# Patient Record
Sex: Female | Born: 1970 | Race: White | Hispanic: No | Marital: Married | Smoking: Never smoker
Health system: Southern US, Community
[De-identification: ages and names within clinical notes are randomized; demographics above are authoritative.]

## PROBLEM LIST (undated history)

## (undated) DIAGNOSIS — F419 Anxiety disorder, unspecified: Secondary | ICD-10-CM

## (undated) DIAGNOSIS — N938 Other specified abnormal uterine and vaginal bleeding: Secondary | ICD-10-CM

## (undated) DIAGNOSIS — E669 Obesity, unspecified: Secondary | ICD-10-CM

## (undated) DIAGNOSIS — Z87440 Personal history of urinary (tract) infections: Secondary | ICD-10-CM

## (undated) DIAGNOSIS — F32A Depression, unspecified: Secondary | ICD-10-CM

## (undated) DIAGNOSIS — F329 Major depressive disorder, single episode, unspecified: Secondary | ICD-10-CM

## (undated) DIAGNOSIS — N979 Female infertility, unspecified: Secondary | ICD-10-CM

## (undated) DIAGNOSIS — J45909 Unspecified asthma, uncomplicated: Secondary | ICD-10-CM

## (undated) DIAGNOSIS — E782 Mixed hyperlipidemia: Secondary | ICD-10-CM

## (undated) DIAGNOSIS — E66811 Obesity, class 1: Secondary | ICD-10-CM

## (undated) DIAGNOSIS — K8689 Other specified diseases of pancreas: Secondary | ICD-10-CM

## (undated) DIAGNOSIS — K219 Gastro-esophageal reflux disease without esophagitis: Secondary | ICD-10-CM

## (undated) DIAGNOSIS — Z8742 Personal history of other diseases of the female genital tract: Secondary | ICD-10-CM

## (undated) DIAGNOSIS — J309 Allergic rhinitis, unspecified: Secondary | ICD-10-CM

## (undated) DIAGNOSIS — R7301 Impaired fasting glucose: Secondary | ICD-10-CM

## (undated) DIAGNOSIS — N951 Menopausal and female climacteric states: Secondary | ICD-10-CM

## (undated) DIAGNOSIS — K589 Irritable bowel syndrome without diarrhea: Secondary | ICD-10-CM

## (undated) DIAGNOSIS — M1990 Unspecified osteoarthritis, unspecified site: Secondary | ICD-10-CM

## (undated) DIAGNOSIS — F5104 Psychophysiologic insomnia: Secondary | ICD-10-CM

## (undated) DIAGNOSIS — E559 Vitamin D deficiency, unspecified: Secondary | ICD-10-CM

## (undated) DIAGNOSIS — O24419 Gestational diabetes mellitus in pregnancy, unspecified control: Secondary | ICD-10-CM

## (undated) HISTORY — DX: Female infertility, unspecified: N97.9

## (undated) HISTORY — DX: Personal history of other diseases of the female genital tract: Z87.42

## (undated) HISTORY — DX: Mixed hyperlipidemia: E78.2

## (undated) HISTORY — DX: Depression, unspecified: F32.A

## (undated) HISTORY — DX: Unspecified asthma, uncomplicated: J45.909

## (undated) HISTORY — DX: Psychophysiologic insomnia: F51.04

## (undated) HISTORY — DX: Personal history of urinary (tract) infections: Z87.440

## (undated) HISTORY — DX: Other specified diseases of pancreas: K86.89

## (undated) HISTORY — PX: COLONOSCOPY: SHX174

## (undated) HISTORY — DX: Gastro-esophageal reflux disease without esophagitis: K21.9

## (undated) HISTORY — DX: Obesity, unspecified: E66.9

## (undated) HISTORY — DX: Other specified abnormal uterine and vaginal bleeding: N93.8

## (undated) HISTORY — DX: Anxiety disorder, unspecified: F41.9

## (undated) HISTORY — DX: Allergic rhinitis, unspecified: J30.9

## (undated) HISTORY — DX: Irritable bowel syndrome, unspecified: K58.9

## (undated) HISTORY — DX: Major depressive disorder, single episode, unspecified: F32.9

## (undated) HISTORY — DX: Obesity, class 1: E66.811

## (undated) HISTORY — DX: Menopausal and female climacteric states: N95.1

## (undated) HISTORY — DX: Impaired fasting glucose: R73.01

## (undated) HISTORY — DX: Vitamin D deficiency, unspecified: E55.9

## (undated) HISTORY — DX: Gestational diabetes mellitus in pregnancy, unspecified control: O24.419

## (undated) HISTORY — DX: Unspecified osteoarthritis, unspecified site: M19.90

---

## 2002-05-08 HISTORY — PX: ESOPHAGOGASTRODUODENOSCOPY: SHX1529

## 2012-07-23 ENCOUNTER — Encounter: Payer: Self-pay | Admitting: Medical

## 2012-07-23 ENCOUNTER — Telehealth: Payer: Self-pay | Admitting: Family Medicine

## 2012-07-23 ENCOUNTER — Ambulatory Visit (INDEPENDENT_AMBULATORY_CARE_PROVIDER_SITE_OTHER): Payer: BC Managed Care – HMO | Admitting: Medical

## 2012-07-23 ENCOUNTER — Other Ambulatory Visit: Payer: Self-pay | Admitting: Medical

## 2012-07-23 VITALS — BP 110/70 | HR 62 | Temp 98.4°F | Resp 16 | Ht 67.0 in | Wt 194.0 lb

## 2012-07-23 DIAGNOSIS — Z1239 Encounter for other screening for malignant neoplasm of breast: Secondary | ICD-10-CM

## 2012-07-23 DIAGNOSIS — Z0000 Encounter for general adult medical examination without abnormal findings: Secondary | ICD-10-CM

## 2012-07-23 DIAGNOSIS — M25549 Pain in joints of unspecified hand: Secondary | ICD-10-CM

## 2012-07-23 DIAGNOSIS — Z1231 Encounter for screening mammogram for malignant neoplasm of breast: Secondary | ICD-10-CM

## 2012-07-23 LAB — COMPREHENSIVE METABOLIC PANEL
Alkaline Phosphatase: 87 U/L (ref 39–117)
BUN: 8 mg/dL (ref 6–23)
Creat: 0.74 mg/dL (ref 0.50–1.10)
Glucose, Bld: 96 mg/dL (ref 70–99)
Sodium: 136 mEq/L (ref 135–145)
Total Bilirubin: 0.6 mg/dL (ref 0.3–1.2)
Total Protein: 7.3 g/dL (ref 6.0–8.3)

## 2012-07-23 LAB — POCT URINALYSIS DIPSTICK
Protein, UA: NEGATIVE
Spec Grav, UA: <=1.005
Urobilinogen, UA: NEGATIVE

## 2012-07-23 LAB — CBC WITH DIFFERENTIAL/PLATELET
Eosinophils Absolute: 0.1 10*3/uL (ref 0.0–0.7)
Eosinophils Relative: 2 % (ref 0–5)
HCT: 41.7 % (ref 36.0–46.0)
Hemoglobin: 14.4 g/dL (ref 12.0–15.0)
Lymphs Abs: 1.7 10*3/uL (ref 0.7–4.0)
MCH: 30.6 pg (ref 26.0–34.0)
MCV: 88.7 fL (ref 78.0–100.0)
Monocytes Relative: 6 % (ref 3–12)
RBC: 4.7 MIL/uL (ref 3.87–5.11)

## 2012-07-23 LAB — LIPID PANEL
HDL: 49 mg/dL (ref >39)
Total CHOL/HDL Ratio: 5.6 Ratio
VLDL: 59 mg/dL — ABNORMAL HIGH (ref 0–40)

## 2012-07-23 LAB — TSH: TSH: 1.792 u[IU]/mL (ref 0.350–4.500)

## 2012-07-23 NOTE — Patient Instructions (Addendum)
Begin daily multivitamin.  Consider calcium 1200mg  + Vitamin D 600mg  daily.  We will set you up for your first screening mammogram.    Let us know about follow up for breast and pelvic examination - here with me, Dr. Lynelle Doctor (female provider), or gynecology.    For joint pains, consider OTC Tylenol tablets orally as needed, topical Capsacian cream, and get regular exercise.    Preventative Care for Adults - Female      MAINTAIN REGULAR HEALTH EXAMS:  A routine yearly physical is a good way to check in with your primary care provider about your health and preventive screening. It is also an opportunity to share updates about your health and any concerns you have, and receive a thorough all-over exam.   Most health insurance companies pay for at least some preventative services.  Check with your health plan for specific coverages.  WHAT PREVENTATIVE SERVICES DO WOMEN NEED?  Adult women should have their weight and blood pressure checked regularly.   Women age 10 and older should have their cholesterol levels checked regularly.  Women should be screened for cervical cancer with a Pap smear and pelvic exam beginning at either age 31, or 3 years after they become sexually activity.    Breast cancer screening generally begins at age 57 with a mammogram and breast exam by your primary care provider.    Beginning at age 67 and continuing to age 19, women should be screened for colorectal cancer.  Certain people may need continued testing until age 31.  Updating vaccinations is part of preventative care.  Vaccinations help protect against diseases such as the flu.  Osteoporosis is a disease in which the bones lose minerals and strength as we age. Women ages 86 and over should discuss this with their caregivers, as should women after menopause who have other risk factors.  Lab tests are generally done as part of preventative care to screen for anemia and blood disorders, to screen for problems  with the kidneys and liver, to screen for bladder problems, to check blood sugar, and to check your cholesterol level.  Preventative services generally include counseling about diet, exercise, avoiding tobacco, drugs, excessive alcohol consumption, and sexually transmitted infections.    GENERAL RECOMMENDATIONS FOR GOOD HEALTH:  Healthy diet:  Eat a variety of foods, including fruit, vegetables, animal or vegetable protein, such as meat, fish, chicken, and eggs, or beans, lentils, tofu, and grains, such as rice.  Drink plenty of water daily.  Decrease saturated fat in the diet, avoid lots of red meat, processed foods, sweets, fast foods, and fried foods.  Exercise:  Aerobic exercise helps maintain good heart health. At least 30-40 minutes of moderate-intensity exercise is recommended. For example, a brisk walk that increases your heart rate and breathing. This should be done on most days of the week.   Find a type of exercise or a variety of exercises that you enjoy so that it becomes a part of your daily life.  Examples are running, walking, swimming, water aerobics, and biking.  For motivation and support, explore group exercise such as aerobic class, spin class, Zumba, Yoga,or  martial arts, etc.    Set exercise goals for yourself, such as a certain weight goal, walk or run in a race such as a 5k walk/run.  Speak to your primary care provider about exercise goals.  Disease prevention:  If you smoke or chew tobacco, find out from your caregiver how to quit. It can literally save  your life, no matter how long you have been a tobacco user. If you do not use tobacco, never begin.   Maintain a healthy diet and normal weight. Increased weight leads to problems with blood pressure and diabetes.   The Body Mass Index or BMI is a way of measuring how much of your body is fat. Having a BMI above 27 increases the risk of heart disease, diabetes, hypertension, stroke and other problems related to  obesity. Your caregiver can help determine your BMI and based on it develop an exercise and dietary program to help you achieve or maintain this important measurement at a healthful level.  High blood pressure causes heart and blood vessel problems.  Persistent high blood pressure should be treated with medicine if weight loss and exercise do not work.   Fat and cholesterol leaves deposits in your arteries that can block them. This causes heart disease and vessel disease elsewhere in your body.  If your cholesterol is found to be high, or if you have heart disease or certain other medical conditions, then you may need to have your cholesterol monitored frequently and be treated with medication.   Ask if you should have a cardiac stress test if your history suggests this. A stress test is a test done on a treadmill that looks for heart disease. This test can find disease prior to there being a problem.  Menopause can be associated with physical symptoms and risks. Hormone replacement therapy is available to decrease these. You should talk to your caregiver about whether starting or continuing to take hormones is right for you.   Osteoporosis is a disease in which the bones lose minerals and strength as we age. This can result in serious bone fractures. Risk of osteoporosis can be identified using a bone density scan. Women ages 8 and over should discuss this with their caregivers, as should women after menopause who have other risk factors. Ask your caregiver whether you should be taking a calcium supplement and Vitamin D, to reduce the rate of osteoporosis.   Avoid drinking alcohol in excess (more than two drinks per day).  Avoid use of street drugs. Do not share needles with anyone. Ask for professional help if you need assistance or instructions on stopping the use of alcohol, cigarettes, and/or drugs.  Brush your teeth twice a day with fluoride toothpaste, and floss once a day. Good oral hygiene  prevents tooth decay and gum disease. The problems can be painful, unattractive, and can cause other health problems. Visit your dentist for a routine oral and dental check up and preventive care every 6-12 months.   Look at your skin regularly.  Use a mirror to look at your back. Notify your caregivers of changes in moles, especially if there are changes in shapes, colors, a size larger than a pencil eraser, an irregular border, or development of new moles.  Safety:  Use seatbelts 100% of the time, whether driving or as a passenger.  Use safety devices such as hearing protection if you work in environments with loud noise or significant background noise.  Use safety glasses when doing any work that could send debris in to the eyes.  Use a helmet if you ride a bike or motorcycle.  Use appropriate safety gear for contact sports.  Talk to your caregiver about gun safety.  Use sunscreen with a SPF (or skin protection factor) of 15 or greater.  Lighter skinned people are at a greater risk of skin cancer.  Don't forget to also wear sunglasses in order to protect your eyes from too much damaging sunlight. Damaging sunlight can accelerate cataract formation.   Practice safe sex. Use condoms. Condoms are used for birth control and to help reduce the spread of sexually transmitted infections (or STIs).  Some of the STIs are gonorrhea (the clap), chlamydia, syphilis, trichomonas, herpes, HPV (human papilloma virus) and HIV (human immunodeficiency virus) which causes AIDS. The herpes, HIV and HPV are viral illnesses that have no cure. These can result in disability, cancer and death.   Keep carbon monoxide and smoke detectors in your home functioning at all times. Change the batteries every 6 months or use a model that plugs into the wall.   Vaccinations:  Stay up to date with your tetanus shots and other required immunizations. You should have a booster for tetanus every 10 years. Be sure to get your flu shot  every year, since 5%-20% of the [place] population comes down with the flu. The flu vaccine changes each year, so being vaccinated once is not enough. Get your shot in the fall, before the flu season peaks.   Other vaccines to consider:  Human Papilloma Virus or HPV causes cancer of the cervix, and other infections that can be transmitted from person to person. There is a vaccine for HPV, and females should get immunized between the ages of 36 and 90. It requires a series of 3 shots.   Pneumococcal vaccine to protect against certain types of pneumonia.  This is normally recommended for adults age 49 or older.  However, adults younger than 41 years old with certain underlying conditions such as diabetes, heart or lung disease should also receive the vaccine.  Shingles vaccine to protect against Varicella Zoster if you are older than age 16, or younger than 41 years old with certain underlying illness.  Hepatitis A vaccine to protect against a form of infection of the liver by a virus acquired from food.  Hepatitis B vaccine to protect against a form of infection of the liver by a virus acquired from blood or body fluids, particularly if you work in health care.  If you plan to travel internationally, check with your local health department for specific vaccination recommendations.  Cancer Screening:  Breast cancer screening is essential to preventive care for women. All women age 74 and older should perform a breast self-exam every month. At age 42 and older, women should have their caregiver complete a breast exam each year. Women at ages 4 and older should have a mammogram (x-ray film) of the breasts. Your caregiver can discuss how often you need mammograms.    Cervical cancer screening includes taking a Pap smear (sample of cells examined under a microscope) from the cervix (end of the uterus). It also includes testing for HPV (Human Papilloma Virus, which can cause cervical cancer). Screening and  a pelvic exam should begin at age 23, or 3 years after a woman becomes sexually active. Screening should occur every year, with a Pap smear but no HPV testing, up to age 27. After age 70, you should have a Pap smear every 3 years with HPV testing, if no HPV was found previously.   Most routine colon cancer screening begins at the age of 9. On a yearly basis, doctors may provide special easy to use take-home tests to check for hidden blood in the stool. Sigmoidoscopy or colonoscopy can detect the earliest forms of colon cancer and is life saving. These tests  use a small camera at the end of a tube to directly examine the colon. Speak to your caregiver about this at age 68, when routine screening begins (and is repeated every 5 years unless early forms of pre-cancerous polyps or small growths are found).

## 2012-07-23 NOTE — Progress Notes (Signed)
Subjective:   HPI  Nancy Adams is a 42 y.o. female who presents for a complete physical.  New patient.   I saw her husband yesterday as new patient.   They just moved here recently from [place].     Preventative care: Last ophthalmology visit:n/a- just moved to the area Last dental visit:just moved to the area  Last colonoscopy: 2005 Last mammogram:not yet Last gynecological exam:june 2013 Last EKG:05/2012 Last labs:n/a  Prior vaccinations: TD or Tdap:2008 Influenza:01/2012 Pneumococcal:n/a Shingles/Zostavax:n/a Other:   Advanced directive:n/a Health care power of attorney:n/a Living will:n/a  Concerns: Pains in thumbs, hx/o toe arthritis.  No injury, trauma, no swelling, no morning stiffness.  Reviewed their medical, surgical, family, social, medication, and allergy history and updated chart as appropriate.   Past Medical History  Diagnosis Date  . Gestational diabetes   . Allergic rhinitis   . Asthma     mild intermittent  . History of urinary tract infection   . GERD (gastroesophageal reflux disease)   . Depression   . Anxiety   . Irritable bowel syndrome (IBS)     mixed type  . Arthritis     great toes, thumbs  . Routine gynecological examination 10/2011    normal pap per pt, no prior abnormal pap    Past Surgical History  Procedure Laterality Date  . Cesarean section    . Colonoscopy  2005    normal per pt  . Esophagogastroduodenoscopy  2004    normal per pt    Family History  Problem Relation Age of Onset  . Arthritis Mother   . Heart disease Father 67  . Hypertension Father   . Asthma Sister   . Allergic rhinitis Sister   . Asthma Brother   . Allergic rhinitis Brother   . Heart disease Paternal Grandmother   . Cancer Paternal Grandmother     leukemia, lived to age 84  . Diabetes Neg Hx   . Stroke Neg Hx     History   Social History  . Marital Status: Married    Spouse Name: N/A    Number of Children: N/A  . Years of Education:  N/A   Occupational History  . Not on file.   Social History Main Topics  . Smoking status: Never Smoker   . Smokeless tobacco: Not on file  . Alcohol Use: 8.4 oz/week    7 Glasses of wine, 7 Cans of beer per week  . Drug Use: No  . Sexually Active: Not on file   Other Topics Concern  . Not on file   Social History Narrative   Married, homemaker, has 2 twin boys, 42yo, exercises with biking, ellipitical, 3-4 days per week, roman catholic    No current outpatient prescriptions on file prior to visit.   No current facility-administered medications on file prior to visit.    Allergies  Allergen Reactions  . Penicillins     Reaction unknown     Review of Systems Constitutional: -fever, -chills, -sweats, -unexpected weight change, -decreased appetite, -fatigue Allergy: -sneezing, -itching, -congestion Dermatology: -changing moles, --rash, -lumps ENT: -runny nose, -ear pain, -sore throat, -hoarseness, -sinus pain, -teeth pain, - ringing in ears, -hearing loss, -nosebleeds Cardiology: -chest pain, +palpitations, -swelling, -difficulty breathing when lying flat, -waking up short of breath Respiratory: -cough, -shortness of breath, -difficulty breathing with exercise or exertion, -wheezing, -coughing up blood Gastroenterology: -abdominal pain, +nausea, -vomiting, +diarrhea, -constipation, -blood in stool, -changes in bowel movement, -difficulty swallowing or eating Hematology: -bleeding, -  bruising  Musculoskeletal: +joint aches, +muscle aches, -joint swelling, -back pain, -neck pain, -cramping, -changes in gait Ophthalmology: denies vision changes, eye redness, itching, discharge Urology: -burning with urination, -difficulty urinating, -blood in urine, -urinary frequency, -urgency, -incontinence Neurology: -headache, -weakness, -tingling, -numbness, -memory loss, -falls, -dizziness Psychology: -depressed mood, -agitation, +sleep problems     Objective:   Physical Exam  Filed  Vitals:   07/23/12 1423  BP: 110/70  Temp: 98.4 F (36.9 C)    General appearance: alert, no distress, WD/WN, white female Skin: few scattered benign appearing lesions, no worrisome lesions HEENT: normocephalic, conjunctiva/corneas normal, sclerae anicteric, PERRLA, EOMi, nares patent, no discharge or erythema, pharynx normal Oral cavity: MMM, tongue normal, teeth in good repair Neck: supple, no lymphadenopathy, no thyromegaly, no masses, normal ROM, no bruits Chest: non tender, normal shape and expansion Heart: RRR, normal S1, S2, no murmurs Lungs: CTA bilaterally, no wheezes, rhonchi, or rales Abdomen: +bs, soft, non tender, non distended, no masses, no hepatomegaly, no splenomegaly, no bruits Back: non tender, normal ROM, no scoliosis Musculoskeletal: upper extremities non tender, no obvious deformity, normal ROM throughout, lower extremities non tender, no obvious deformity, normal ROM throughout Extremities: no edema, no cyanosis, no clubbing Pulses: 2+ symmetric, upper and lower extremities, normal cap refill Neurological: alert, oriented x 3, CN2-12 intact, strength normal upper extremities and lower extremities, sensation normal throughout, DTRs 2+ throughout, no cerebellar signs, gait normal Psychiatric: normal affect, behavior normal, pleasant  Breast/gyn - deferred   Assessment and Plan :    Encounter Diagnoses  Name Primary?  . Routine general medical examination at a health care facility Yes  . Screening for breast cancer   . Arthralgia of hand, unspecified laterality     Physical exam - discussed healthy lifestyle, diet, exercise, preventative care, vaccinations, and addressed their concerns.  Handout given.  Limit ETOH use.  Advised opthalmolgoy and dentist f/u.    Refer for mammogram.  She will consider options for routine gyn care.   She can return here for female exam with me, Dr. Lynelle Doctor, or we can refer to gyn.  She will let us know.  Arthralgia - advised daily  exercise, can use OTC Tylenol, topical capsacian cream prn.  Follow-up pending labs

## 2012-07-23 NOTE — Telephone Encounter (Signed)
Patient is aware of her mammogram appointment on 08/21/12/@ 1200 pm @ [facility]. CLS [phone]

## 2012-07-24 ENCOUNTER — Other Ambulatory Visit: Payer: Self-pay | Admitting: Medical

## 2012-07-24 LAB — VITAMIN D 25 HYDROXY (VIT D DEFICIENCY, FRACTURES): Vit D, 25-Hydroxy: 15 ng/mL — ABNORMAL LOW (ref 30–89)

## 2012-07-24 MED ORDER — ERGOCALCIFEROL 1.25 MG (50000 UT) PO CAPS: 50000.0000 [IU] | ORAL_CAPSULE | ORAL | Status: DC

## 2012-07-29 ENCOUNTER — Ambulatory Visit (INDEPENDENT_AMBULATORY_CARE_PROVIDER_SITE_OTHER): Payer: BC Managed Care – HMO | Admitting: Medical

## 2012-07-29 ENCOUNTER — Telehealth: Payer: Self-pay | Admitting: Family Medicine

## 2012-07-29 ENCOUNTER — Encounter: Payer: Self-pay | Admitting: Medical

## 2012-07-29 VITALS — BP 98/70 | HR 68 | Temp 97.9°F | Wt 197.0 lb

## 2012-07-29 DIAGNOSIS — N390 Urinary tract infection, site not specified: Secondary | ICD-10-CM

## 2012-07-29 LAB — POCT URINALYSIS DIPSTICK
Ketones, UA: NEGATIVE
Protein, UA: NEGATIVE
Spec Grav, UA: <=1.005
Urobilinogen, UA: NEGATIVE
pH, UA: 7

## 2012-07-29 MED ORDER — NITROFURANTOIN MONOHYD MACRO 100 MG PO CAPS: 100.0000 mg | ORAL_CAPSULE | Freq: Two times a day (BID) | ORAL | Status: DC

## 2012-07-29 NOTE — Telephone Encounter (Signed)
Patient is aware of her appointment with Dr. Seymour Bars on 08/08/12 @ 1045. I fax over OV notes, labs and insurance card. [phone] [facility]

## 2012-07-29 NOTE — Progress Notes (Signed)
Subjective:  Nancy Adams is a 43 y.o. female who complains of burning with urination, dysuria, frequency, suprapubic pressure and urgency. She has had symptoms for 3 days. Patient also complains of cloudy urine and LGF last night. Patient denies back pain and nausea, vomiting. Patient does not have a history of recurrent UTI. Patient does not have a history of pyelonephritis.  Last UTI years ago.  She did have intercourse recently.   No other aggravating or relieving factors.   Objective:    Filed Vitals:   07/29/12 1059  BP: 98/70  Pulse: 68  Temp: 97.9 F (36.6 C)    General appearance: alert, no distress, WD/WN, female Abdomen: +bs, soft, mild lower abdominal generalized tenderness, no upper abdominal tenderness, non distended, no masses, no hepatomegaly, no splenomegaly, no bruits Back: no CVA tenderness Pulses: 2+ symmetric      Assessment and Plan:   Encounter Diagnosis  Name Primary?  . UTI (urinary tract infection) Yes   Given the UA findings and symptoms, begin Macrobid empirically.  Increase water intake, add cranberry juice oral, begin Macrobid, urine sent for culture, recheck if not improving in the next few days.

## 2012-08-21 ENCOUNTER — Ambulatory Visit: Payer: Self-pay

## 2012-09-02 ENCOUNTER — Other Ambulatory Visit: Payer: Self-pay | Admitting: Medical

## 2012-09-02 ENCOUNTER — Telehealth: Payer: Self-pay | Admitting: Medical

## 2012-09-02 MED ORDER — SERTRALINE HCL 25 MG PO TABS: ORAL_TABLET | ORAL | Status: DC

## 2012-09-02 NOTE — Telephone Encounter (Signed)
Fax refill request from [facility]  Zoloft 50 mg #30 last filled 06/21/12

## 2012-09-25 ENCOUNTER — Ambulatory Visit
Admission: RE | Admit: 2012-09-25 | Discharge: 2012-09-25 | Disposition: A | Discharge status: Home / Self Care | Payer: BC Managed Care – PPO | Source: Ambulatory Visit | Attending: Medical | Admitting: Medical

## 2012-09-25 DIAGNOSIS — Z1231 Encounter for screening mammogram for malignant neoplasm of breast: Secondary | ICD-10-CM

## 2012-10-02 ENCOUNTER — Encounter: Payer: Self-pay | Admitting: Family Medicine

## 2012-11-18 ENCOUNTER — Telehealth: Payer: Self-pay | Admitting: Medical

## 2012-11-25 ENCOUNTER — Other Ambulatory Visit: Payer: Self-pay | Admitting: Medical

## 2012-11-25 ENCOUNTER — Telehealth: Payer: Self-pay | Admitting: Medical

## 2012-11-25 MED ORDER — SERTRALINE HCL 25 MG PO TABS: ORAL_TABLET | ORAL | Status: DC

## 2012-11-25 NOTE — Telephone Encounter (Signed)
25 mg prescription sent.

## 2012-11-26 NOTE — Telephone Encounter (Signed)
RX WAS CHANGED TO 1 A DAY & I CALLED [facility] & NEW RX WENT THRU, PT INFORMED

## 2013-02-25 ENCOUNTER — Other Ambulatory Visit: Payer: Self-pay | Admitting: Medical

## 2013-02-25 NOTE — Telephone Encounter (Signed)
I called and let a detailed message for the patient making her aware that she will need to make a OV. CLS

## 2013-02-25 NOTE — Telephone Encounter (Signed)
IS THIS OK 

## 2013-07-18 ENCOUNTER — Encounter: Payer: Self-pay | Admitting: Family Medicine

## 2013-07-18 ENCOUNTER — Ambulatory Visit (INDEPENDENT_AMBULATORY_CARE_PROVIDER_SITE_OTHER): Payer: BC Managed Care – PPO | Admitting: Family Medicine

## 2013-07-18 VITALS — BP 131/94 | HR 85 | Temp 98.4°F | Resp 18 | Ht 66.0 in | Wt 203.0 lb

## 2013-07-18 DIAGNOSIS — K589 Irritable bowel syndrome without diarrhea: Secondary | ICD-10-CM

## 2013-07-18 DIAGNOSIS — F341 Dysthymic disorder: Secondary | ICD-10-CM

## 2013-07-18 DIAGNOSIS — F32A Depression, unspecified: Secondary | ICD-10-CM

## 2013-07-18 DIAGNOSIS — Z124 Encounter for screening for malignant neoplasm of cervix: Secondary | ICD-10-CM | POA: Insufficient documentation

## 2013-07-18 DIAGNOSIS — F419 Anxiety disorder, unspecified: Secondary | ICD-10-CM

## 2013-07-18 DIAGNOSIS — K582 Mixed irritable bowel syndrome: Secondary | ICD-10-CM | POA: Insufficient documentation

## 2013-07-18 DIAGNOSIS — F329 Major depressive disorder, single episode, unspecified: Secondary | ICD-10-CM

## 2013-07-18 MED ORDER — SERTRALINE HCL 25 MG PO TABS: ORAL_TABLET | ORAL | Status: DC

## 2013-07-18 MED ORDER — HYOSCYAMINE SULFATE 0.125 MG SL SUBL: SUBLINGUAL_TABLET | SUBLINGUAL | Status: DC

## 2013-07-18 NOTE — Progress Notes (Signed)
Office Note 07/18/2013  CC:  Chief Complaint  Patient presents with  . Establish Care    moved from [place]   . Irritable Bowel Syndrome    HPI:  Nancy Adams is a 43 y.o. White female who is here to establish care. Patient's most recent primary MD: Saw Mr.Tysinger (PA) in [place] [facility] once for initial visit/CPE last year. Moved here from [place] just over a year ago. Old records in EPIC/[facility] EMR were reviewed prior to or during today's visit.  Says IBS worse last 6 mo, she thinks maybe b/c of poor diet lately, also stressful life changes with move the last 1 yr.  Postprandial diarrhea is what is specifically worse.   Saw a GI MD about 10 yrs ago--battery of tests done.  No blood or mucous in stool.  Has been on miralax in past but nothing for loose BMs.    Past Medical History  Diagnosis Date  . Gestational diabetes   . Allergic rhinitis   . Asthma     mild intermittent  . History of urinary tract infection   . GERD (gastroesophageal reflux disease)   . Depression   . Anxiety   . Irritable bowel syndrome (IBS)     mixed type  . Arthritis     great toes, thumbs  . Routine gynecological examination 10/2011    normal pap per pt, no prior abnormal pap    Past Surgical History  Procedure Laterality Date  . Cesarean section    . Colonoscopy  2005    normal per pt  . Esophagogastroduodenoscopy  2004    normal per pt    Family History  Problem Relation Age of Onset  . Arthritis Mother   . Allergic rhinitis Mother   . Heart disease Father 55  . Hypertension Father   . Asthma Sister   . Allergic rhinitis Sister   . Asthma Brother   . Allergic rhinitis Brother   . Heart disease Paternal Grandmother   . Cancer Paternal Grandmother     leukemia, lived to age 42  . Diabetes Neg Hx   . Stroke Neg Hx     History   Social History  . Marital Status: Married    Spouse Name: N/A    Number of Children: N/A  . Years of Education: N/A   Occupational History   . Not on file.   Social History Main Topics  . Smoking status: Never Smoker   . Smokeless tobacco: Never Used  . Alcohol Use: 0.0 oz/week     Comment: socially  . Drug Use: No  . Sexual Activity: Not on file   Other Topics Concern  . Not on file   Social History Narrative   Married, has 2 twin boys, Careers information officer.   Relocated from [place].   Exercises with biking, ellipitical, 3-4 days per week, [employer].   No tobacco.  No drugs.  Social alcohol.   Not taking nitrofurantoin or vitamin D listed below Outpatient Encounter Prescriptions as of 07/18/2013  Medication Sig  . albuterol (PROVENTIL HFA;VENTOLIN HFA) 108 (90 BASE) MCG/ACT inhaler Inhale 2 puffs into the lungs every 6 (six) hours as needed for wheezing.  Marland Kitchen doxylamine, Sleep, (UNISOM) 25 MG tablet Take 25 mg by mouth at bedtime as needed for sleep.  Marland Kitchen sertraline (ZOLOFT) 25 MG tablet TAKE ONE TABLET BY MOUTH ONE TIME DAILY  . [DISCONTINUED] sertraline (ZOLOFT) 25 MG tablet TAKE ONE TABLET BY MOUTH ONE TIME  DAILY   . ergocalciferol (VITAMIN D2) 50000 UNITS capsule Take 1 capsule (50,000 Units total) by mouth once a week.  . hyoscyamine (LEVSIN SL) 0.125 MG SL tablet 1 tab po qid prn abd bloating/cramping/frequent loose BMs  . [DISCONTINUED] nitrofurantoin, macrocrystal-monohydrate, (MACROBID) 100 MG capsule Take 1 capsule (100 mg total) by mouth 2 (two) times daily.    Allergies  Allergen Reactions  . Penicillins     Reaction unknown    ROS Review of Systems  Constitutional: Negative for fever and fatigue.  HENT: Negative for congestion and sore throat.   Eyes: Negative for visual disturbance.  Respiratory: Negative for cough.   Cardiovascular: Negative for chest pain.  Gastrointestinal: Positive for abdominal pain and diarrhea. Negative for nausea.       Per hpi   Genitourinary: Negative for dysuria.  Musculoskeletal: Negative for back pain and joint swelling.  Skin: Negative for rash.   Neurological: Negative for weakness and headaches.  Hematological: Negative for adenopathy.    PE; Blood pressure 131/94, pulse 85, temperature 98.4 F (36.9 C), temperature source Temporal, resp. rate 18, height 5' 6"  (1.676 m), weight 203 lb (92.08 kg), SpO2 97.00%. Gen: Alert, well appearing.  Patient is oriented to person, place, time, and situation. VUY:EBXI: no injection, icteris, swelling, or exudate.  EOMI, PERRLA. Mouth: lips without lesion/swelling.  Oral mucosa pink and moist. Oropharynx without erythema, exudate, or swelling.  CV: RRR, no m/r/g.   LUNGS: CTA bilat, nonlabored resps, good aeration in all lung fields. ABD: soft, NT, ND, BS normal.  No hepatospenomegaly or mass.  No bruits. EXT: no clubbing, cyanosis, or edema.   Pertinent labs:  none  ASSESSMENT AND PLAN:   New pt; no old records to obtain at this time.  Irritable bowel syndrome with constipation and diarrhea Diarrhea predominant the last few years. Will do trial of levsin 0.125, 1-2 qid prn. Therapeutic expectations and side effect profile of medication discussed today.  Patient's questions answered.   Anxiety and depression Stable on 37m sertraline qd. RF'd this med today.  Cervical cancer screening Pt desires referral to GYN for this. No hx of abnormal PAPs.  An After Visit Summary was printed and given to the patient.  Return for at patient's convenience for fasting CPE.

## 2013-07-18 NOTE — Assessment & Plan Note (Signed)
Diarrhea predominant the last few years. Will do trial of levsin 0.125, 1-2 qid prn. Therapeutic expectations and side effect profile of medication discussed today.  Patient's questions answered.

## 2013-07-18 NOTE — Assessment & Plan Note (Signed)
Pt desires referral to GYN for this. No hx of abnormal PAPs.

## 2013-07-18 NOTE — Assessment & Plan Note (Signed)
Stable on 82m sertraline qd. RF'd this med today.

## 2013-07-18 NOTE — Progress Notes (Signed)
Pre visit review using our clinic review tool, if applicable. No additional management support is needed unless otherwise documented below in the visit note. 

## 2013-07-24 ENCOUNTER — Encounter: Payer: Self-pay | Admitting: Family Medicine

## 2013-07-24 ENCOUNTER — Ambulatory Visit (INDEPENDENT_AMBULATORY_CARE_PROVIDER_SITE_OTHER): Payer: BC Managed Care – PPO | Admitting: Family Medicine

## 2013-07-24 VITALS — BP 112/78 | HR 76 | Temp 97.4°F | Resp 18 | Ht 66.0 in | Wt 204.0 lb

## 2013-07-24 DIAGNOSIS — E559 Vitamin D deficiency, unspecified: Secondary | ICD-10-CM

## 2013-07-24 DIAGNOSIS — Z0000 Encounter for general adult medical examination without abnormal findings: Secondary | ICD-10-CM

## 2013-07-24 NOTE — Progress Notes (Signed)
Office Note 07/24/2013  CC:  Chief Complaint  Patient presents with  . Annual Exam    HPI:  Nancy Adams is a 43 y.o. White female who is here for CPE. No complaints. Has not had a chance to try the levsin I rx'd for her IBS recently. She is set up to see the GYN in near future.   Past Medical History  Diagnosis Date  . Gestational diabetes   . Allergic rhinitis   . Asthma     mild intermittent  . History of urinary tract infection   . GERD (gastroesophageal reflux disease)   . Depression   . Anxiety   . Irritable bowel syndrome (IBS)     mixed type  . Arthritis     great toes, thumbs  . Routine gynecological examination 10/2011    normal pap per pt, no prior abnormal pap  . Vitamin D deficiency     dx'd at [facility] 2014 per pt--she did not take any replacement vit D    Past Surgical History  Procedure Laterality Date  . Cesarean section    . Colonoscopy  2005    normal per pt  . Esophagogastroduodenoscopy  2004    normal per pt    Family History  Problem Relation Age of Onset  . Arthritis Mother   . Allergic rhinitis Mother   . Heart disease Father 25  . Hypertension Father   . Asthma Sister   . Allergic rhinitis Sister   . Asthma Brother   . Allergic rhinitis Brother   . Heart disease Paternal Grandmother   . Cancer Paternal Grandmother     leukemia, lived to age 78  . Diabetes Neg Hx   . Stroke Neg Hx     History   Social History  . Marital Status: Married    Spouse Name: N/A    Number of Children: N/A  . Years of Education: N/A   Occupational History  . Not on file.   Social History Main Topics  . Smoking status: Never Smoker   . Smokeless tobacco: Never Used  . Alcohol Use: 0.0 oz/week     Comment: socially  . Drug Use: No  . Sexual Activity: Not on file   Other Topics Concern  . Not on file   Social History Narrative   Married, has 2 twin boys, Careers information officer.   Relocated from [place].   Exercises with biking,  ellipitical, 3-4 days per week, [employer].   No tobacco.  No drugs.  Social alcohol.    Outpatient Prescriptions Prior to Visit  Medication Sig Dispense Refill  . albuterol (PROVENTIL HFA;VENTOLIN HFA) 108 (90 BASE) MCG/ACT inhaler Inhale 2 puffs into the lungs every 6 (six) hours as needed for wheezing.      Marland Kitchen doxylamine, Sleep, (UNISOM) 25 MG tablet Take 25 mg by mouth at bedtime as needed for sleep.      . ergocalciferol (VITAMIN D2) 50000 UNITS capsule Take 1 capsule (50,000 Units total) by mouth once a week.  4 capsule  2  . sertraline (ZOLOFT) 25 MG tablet TAKE ONE TABLET BY MOUTH ONE TIME DAILY  90 tablet  3  . hyoscyamine (LEVSIN SL) 0.125 MG SL tablet 1 tab po qid prn abd bloating/cramping/frequent loose BMs  30 tablet  3   No facility-administered medications prior to visit.    Allergies  Allergen Reactions  . Penicillins     Reaction unknown    ROS Review of  Systems  Constitutional: Negative for fever, chills, appetite change and fatigue.  HENT: Negative for congestion, dental problem, ear pain and sore throat.   Eyes: Negative for discharge, redness and visual disturbance.  Respiratory: Negative for cough, chest tightness, shortness of breath and wheezing.   Cardiovascular: Negative for chest pain, palpitations and leg swelling.  Gastrointestinal: Negative for nausea, vomiting, abdominal pain, diarrhea and blood in stool.  Genitourinary: Negative for dysuria, urgency, frequency, hematuria, flank pain and difficulty urinating.  Musculoskeletal: Negative for arthralgias, back pain, joint swelling, myalgias and neck stiffness.  Skin: Negative for pallor and rash.  Neurological: Negative for dizziness, speech difficulty, weakness and headaches.  Hematological: Negative for adenopathy. Does not bruise/bleed easily.  Psychiatric/Behavioral: Negative for confusion and sleep disturbance. The patient is not nervous/anxious.     PE; Blood pressure 112/78, pulse 76,  temperature 97.4 F (36.3 C), temperature source Temporal, resp. rate 18, height 5' 6"  (1.676 m), weight 204 lb (92.534 kg), last menstrual period 06/27/2013, SpO2 97.00%. Pt examined with Einar Grad, CMA as chaperone. Gen: Alert, well appearing, overweight-appearing WF in NAD.  Patient is oriented to person, place, time, and situation. AFFECT: pleasant, lucid thought and speech. ENT: Ears: EACs clear, normal epithelium.  TMs with good light reflex and landmarks bilaterally.  Eyes: no injection, icteris, swelling, or exudate.  EOMI, PERRLA. Nose: no drainage or turbinate edema/swelling.  No injection or focal lesion.  Mouth: lips without lesion/swelling.  Oral mucosa pink and moist.  Dentition intact and without obvious caries or gingival swelling.  Oropharynx without erythema, exudate, or swelling.  Neck: supple/nontender.  No LAD, mass, or TM.  Carotid pulses 2+ bilaterally, without bruits. CV: RRR, no m/r/g.   LUNGS: CTA bilat, nonlabored resps, good aeration in all lung fields. ABD: soft, NT, ND, BS normal.  No hepatospenomegaly or mass.  No bruits. EXT: no clubbing, cyanosis, or edema.  Musculoskeletal: no joint swelling, erythema, warmth, or tenderness.  ROM of all joints intact. Skin - no sores or suspicious lesions or rashes or color changes Neuro: CN 2-12 intact bilaterally, strength 5/5 in proximal and distal upper extremities and lower extremities bilaterally.   No tremor.  No disdiadochokinesis.  No ataxia.  Upper extremity and lower extremity DTRs symmetric.  No pronator drift.  Pertinent labs:  None today  ASSESSMENT AND PLAN:   Health maintenance examination Reviewed age and gender appropriate health maintenance issues (prudent diet, regular exercise, health risks of tobacco and excessive alcohol, use of seatbelts, fire alarms in home, use of sunscreen).  Also reviewed age and gender appropriate health screening as well as vaccine recommendations. Has GYN visit coming  up. HP labs + vit D level (for hx of vit D deficiency) drawn today.   An After Visit Summary was printed and given to the patient.  FOLLOW UP:  Return in about 1 year (around 07/25/2014) for annual CPE (fasting).

## 2013-07-24 NOTE — Progress Notes (Signed)
Pre visit review using our clinic review tool, if applicable. No additional management support is needed unless otherwise documented below in the visit note. 

## 2013-07-24 NOTE — Assessment & Plan Note (Signed)
Reviewed age and gender appropriate health maintenance issues (prudent diet, regular exercise, health risks of tobacco and excessive alcohol, use of seatbelts, fire alarms in home, use of sunscreen).  Also reviewed age and gender appropriate health screening as well as vaccine recommendations. Has GYN visit coming up. HP labs + vit D level (for hx of vit D deficiency) drawn today.

## 2013-07-25 LAB — COMPREHENSIVE METABOLIC PANEL
ALBUMIN: 4.3 g/dL (ref 3.5–5.2)
ALT: 22 U/L (ref 0–35)
AST: 23 U/L (ref 0–37)
Alkaline Phosphatase: 79 U/L (ref 39–117)
BUN: 7 mg/dL (ref 6–23)
CO2: 26 mEq/L (ref 19–32)
Calcium: 9.1 mg/dL (ref 8.4–10.5)
Chloride: 100 mEq/L (ref 96–112)
Creatinine, Ser: 0.7 mg/dL (ref 0.4–1.2)
GFR: 104.58 mL/min (ref >60.00)
GLUCOSE: 88 mg/dL (ref 70–99)
Potassium: 4.1 mEq/L (ref 3.5–5.1)
SODIUM: 136 meq/L (ref 135–145)
TOTAL PROTEIN: 7.6 g/dL (ref 6.0–8.3)
Total Bilirubin: 0.6 mg/dL (ref 0.3–1.2)

## 2013-07-25 LAB — LIPID PANEL
Cholesterol: 265 mg/dL — ABNORMAL HIGH (ref 0–200)
HDL: 49.3 mg/dL (ref >39.00)
LDL CALC: 162 mg/dL — AB (ref 0–99)
Total CHOL/HDL Ratio: 5
Triglycerides: 268 mg/dL — ABNORMAL HIGH (ref 0.0–149.0)
VLDL: 53.6 mg/dL — ABNORMAL HIGH (ref 0.0–40.0)

## 2013-07-25 LAB — CBC WITH DIFFERENTIAL/PLATELET
Basophils Absolute: 0 10*3/uL (ref 0.0–0.1)
Basophils Relative: 0.3 % (ref 0.0–3.0)
EOS PCT: 2.3 % (ref 0.0–5.0)
Eosinophils Absolute: 0.2 10*3/uL (ref 0.0–0.7)
HCT: 41.9 % (ref 36.0–46.0)
Hemoglobin: 14.1 g/dL (ref 12.0–15.0)
Lymphocytes Relative: 17.9 % (ref 12.0–46.0)
Lymphs Abs: 1.5 10*3/uL (ref 0.7–4.0)
MCHC: 33.6 g/dL (ref 30.0–36.0)
MCV: 95 fl (ref 78.0–100.0)
Monocytes Absolute: 0.4 10*3/uL (ref 0.1–1.0)
Monocytes Relative: 4.2 % (ref 3.0–12.0)
Neutro Abs: 6.4 10*3/uL (ref 1.4–7.7)
Neutrophils Relative %: 75.3 % (ref 43.0–77.0)
PLATELETS: 273 10*3/uL (ref 150.0–400.0)
RBC: 4.41 Mil/uL (ref 3.87–5.11)
RDW: 12.8 % (ref 11.5–14.6)
WBC: 8.6 10*3/uL (ref 4.5–10.5)

## 2013-07-25 LAB — TSH: TSH: 1 u[IU]/mL (ref 0.35–5.50)

## 2013-07-25 LAB — VITAMIN D 25 HYDROXY (VIT D DEFICIENCY, FRACTURES): Vit D, 25-Hydroxy: 27 ng/mL — ABNORMAL LOW (ref 30–89)

## 2013-12-18 ENCOUNTER — Other Ambulatory Visit: Payer: Self-pay

## 2013-12-18 DIAGNOSIS — Z1231 Encounter for screening mammogram for malignant neoplasm of breast: Secondary | ICD-10-CM

## 2014-01-02 ENCOUNTER — Ambulatory Visit
Admission: RE | Admit: 2014-01-02 | Discharge: 2014-01-02 | Disposition: A | Discharge status: Home / Self Care | Payer: BC Managed Care – PPO | Source: Ambulatory Visit

## 2014-01-02 DIAGNOSIS — Z1231 Encounter for screening mammogram for malignant neoplasm of breast: Secondary | ICD-10-CM

## 2014-05-08 DIAGNOSIS — N938 Other specified abnormal uterine and vaginal bleeding: Secondary | ICD-10-CM

## 2014-05-08 HISTORY — DX: Other specified abnormal uterine and vaginal bleeding: N93.8

## 2014-07-29 ENCOUNTER — Encounter: Payer: Self-pay | Admitting: Family Medicine

## 2014-08-03 ENCOUNTER — Encounter: Payer: Self-pay | Admitting: Family Medicine

## 2014-08-03 ENCOUNTER — Ambulatory Visit (INDEPENDENT_AMBULATORY_CARE_PROVIDER_SITE_OTHER): Payer: BLUE CROSS/BLUE SHIELD | Admitting: Family Medicine

## 2014-08-03 VITALS — BP 143/80 | HR 81 | Temp 98.1°F | Wt 202.0 lb

## 2014-08-03 DIAGNOSIS — Z0000 Encounter for general adult medical examination without abnormal findings: Secondary | ICD-10-CM

## 2014-08-03 DIAGNOSIS — N915 Oligomenorrhea, unspecified: Secondary | ICD-10-CM

## 2014-08-03 DIAGNOSIS — G4700 Insomnia, unspecified: Secondary | ICD-10-CM

## 2014-08-03 DIAGNOSIS — F411 Generalized anxiety disorder: Secondary | ICD-10-CM

## 2014-08-03 DIAGNOSIS — G2581 Restless legs syndrome: Secondary | ICD-10-CM

## 2014-08-03 LAB — COMPREHENSIVE METABOLIC PANEL
ALK PHOS: 90 U/L (ref 39–117)
ALT: 29 U/L (ref 0–35)
AST: 20 U/L (ref 0–37)
Albumin: 4.2 g/dL (ref 3.5–5.2)
BILIRUBIN TOTAL: 0.6 mg/dL (ref 0.2–1.2)
BUN: 5 mg/dL — ABNORMAL LOW (ref 6–23)
CHLORIDE: 99 meq/L (ref 96–112)
CO2: 30 mEq/L (ref 19–32)
Calcium: 9.4 mg/dL (ref 8.4–10.5)
Creatinine, Ser: 0.68 mg/dL (ref 0.40–1.20)
GFR: 100.54 mL/min (ref >60.00)
Glucose, Bld: 97 mg/dL (ref 70–99)
Potassium: 4.2 mEq/L (ref 3.5–5.1)
SODIUM: 135 meq/L (ref 135–145)
Total Protein: 7.3 g/dL (ref 6.0–8.3)

## 2014-08-03 LAB — CBC WITH DIFFERENTIAL/PLATELET
BASOS ABS: 0 10*3/uL (ref 0.0–0.1)
Basophils Relative: 0.4 % (ref 0.0–3.0)
EOS ABS: 0.3 10*3/uL (ref 0.0–0.7)
Eosinophils Relative: 3.6 % (ref 0.0–5.0)
HEMATOCRIT: 40 % (ref 36.0–46.0)
HEMOGLOBIN: 13.5 g/dL (ref 12.0–15.0)
LYMPHS ABS: 1.9 10*3/uL (ref 0.7–4.0)
Lymphocytes Relative: 20.7 % (ref 12.0–46.0)
MCHC: 33.8 g/dL (ref 30.0–36.0)
MCV: 93.1 fl (ref 78.0–100.0)
MONO ABS: 0.5 10*3/uL (ref 0.1–1.0)
Monocytes Relative: 6 % (ref 3.0–12.0)
NEUTROS ABS: 6.4 10*3/uL (ref 1.4–7.7)
Neutrophils Relative %: 69.3 % (ref 43.0–77.0)
PLATELETS: 319 10*3/uL (ref 150.0–400.0)
RBC: 4.3 Mil/uL (ref 3.87–5.11)
RDW: 13.1 % (ref 11.5–15.5)
WBC: 9.2 10*3/uL (ref 4.0–10.5)

## 2014-08-03 LAB — LIPID PANEL
CHOL/HDL RATIO: 5
CHOLESTEROL: 228 mg/dL — AB (ref 0–200)
HDL: 43.4 mg/dL (ref >39.00)
LDL CALC: 146 mg/dL — AB (ref 0–99)
NonHDL: 184.6
TRIGLYCERIDES: 192 mg/dL — AB (ref 0.0–149.0)
VLDL: 38.4 mg/dL (ref 0.0–40.0)

## 2014-08-03 LAB — POCT URINE PREGNANCY: Preg Test, Ur: NEGATIVE

## 2014-08-03 LAB — TSH: TSH: 2.6 u[IU]/mL (ref 0.35–4.50)

## 2014-08-03 MED ORDER — SERTRALINE HCL 50 MG PO TABS: 50.0000 mg | ORAL_TABLET | Freq: Every day | ORAL | Status: DC

## 2014-08-03 MED ORDER — CLONAZEPAM 0.5 MG PO TABS: ORAL_TABLET | ORAL | Status: DC

## 2014-08-03 NOTE — Progress Notes (Signed)
Pre visit review using our clinic review tool, if applicable. No additional management support is needed unless otherwise documented below in the visit note. 

## 2014-08-03 NOTE — Progress Notes (Signed)
Office Note 08/03/2014  CC:  Chief Complaint  Patient presents with  . Annual Exam    HPI:  Nancy Adams is a 44 y.o. White female who is here for annual health maintenance exam. LMP was 06/08/14. For the last year her menstrual cycles have been from 1 mo to 3 mo in between.  She has her annual GYn exam in 08/2014.  She is sexually active and does not use contraception.  Of note, levsin trial for a couple weeks did not help her IBS. Says she wants to increase her zoloft dose to 108m qhs, says the year has been very stressful/anxiety. Sites irritability, constant feeling of "keyed up", concentration impaired, low energy, constant worry.  No panic attacks.  No signif depression.  Sleep initiation, sleep maintenance, and sleep quality all a problem.  Chronic (decades) problem for her, worse the last 1 yr or so.  Ambien caused some parasomnia problems so she had to stop it.  Unisom OTC currently-has to take 3 hs to help any.  Mind will not shut down, but also in last year she endorses restless leg symptoms.  Getting up and walking around doesn't help her legs.  No daytime RLS symptoms.  Sounds like sleep hygiene is not very good due to busy/hectic life taking care of twins.     Past Medical History  Diagnosis Date  . Gestational diabetes   . Allergic rhinitis   . Asthma     mild intermittent  . History of urinary tract infection   . GERD (gastroesophageal reflux disease)   . Depression   . Anxiety   . Irritable bowel syndrome (IBS)     mixed type  . Arthritis     great toes, thumbs  . Routine gynecological examination 10/2011    normal pap per pt, no prior abnormal pap  . Vitamin D deficiency     dx'd at [facility] 2014 per pt--she did not take any replacement vit D  . Obesity, Class I, BMI 30-34.9     Past Surgical History  Procedure Laterality Date  . Cesarean section    . Colonoscopy  2005    normal per pt  . Esophagogastroduodenoscopy  2004    normal per pt    Family  History  Problem Relation Age of Onset  . Arthritis Mother   . Allergic rhinitis Mother   . Heart disease Father 630 . Hypertension Father   . Asthma Sister   . Allergic rhinitis Sister   . Asthma Brother   . Allergic rhinitis Brother   . Heart disease Paternal Grandmother   . Cancer Paternal Grandmother     leukemia, lived to age 43 . Diabetes Neg Hx   . Stroke Neg Hx     History   Social History  . Marital Status: Married    Spouse Name: N/A  . Number of Children: N/A  . Years of Education: N/A   Occupational History  . Not on file.   Social History Main Topics  . Smoking status: Never Smoker   . Smokeless tobacco: Never Used  . Alcohol Use: 0.0 oz/week     Comment: socially  . Drug Use: No  . Sexual Activity: Not on file   Other Topics Concern  . Not on file   Social History Narrative   Married, has 2 twin boys, elementary age.   Homemaker.   [place]   Exercises with biking, ellipitical, 3-4 days per week, [employer]  No tobacco.  No drugs.  Social alcohol.   MEDS: taking ca+ plus vit D tab: 2 gummies qd (not the 50K U dose q week) Outpatient Prescriptions Prior to Visit  Medication Sig Dispense Refill  . albuterol (PROVENTIL HFA;VENTOLIN HFA) 108 (90 BASE) MCG/ACT inhaler Inhale 2 puffs into the lungs every 6 (six) hours as needed for wheezing.    Marland Kitchen doxylamine, Sleep, (UNISOM) 25 MG tablet Take 25 mg by mouth at bedtime as needed for sleep.    . ergocalciferol (VITAMIN D2) 50000 UNITS capsule Take 1 capsule (50,000 Units total) by mouth once a week. 4 capsule 2  . sertraline (ZOLOFT) 25 MG tablet TAKE ONE TABLET BY MOUTH ONE TIME DAILY 90 tablet 3  . hyoscyamine (LEVSIN SL) 0.125 MG SL tablet 1 tab po qid prn abd bloating/cramping/frequent loose BMs (Patient not taking: Reported on 08/03/2014) 30 tablet 3   No facility-administered medications prior to visit.    Allergies  Allergen Reactions  . Penicillins     Reaction  unknown   ROS Review of Systems  Constitutional: Negative for fever, chills, appetite change and fatigue.  HENT: Negative for congestion, dental problem, ear pain and sore throat.   Eyes: Negative for discharge, redness and visual disturbance.  Respiratory: Negative for cough, chest tightness, shortness of breath and wheezing.   Cardiovascular: Negative for chest pain, palpitations and leg swelling.  Gastrointestinal: Negative for nausea, vomiting, abdominal pain, diarrhea and blood in stool.  Genitourinary: Negative for dysuria, urgency, frequency, hematuria, flank pain and difficulty urinating.  Musculoskeletal: Positive for arthralgias (feet pain). Negative for myalgias, back pain, joint swelling and neck stiffness.  Skin: Negative for pallor and rash.  Neurological: Negative for dizziness, speech difficulty, weakness and headaches.  Hematological: Negative for adenopathy. Does not bruise/bleed easily.  Psychiatric/Behavioral: Positive for sleep disturbance. Negative for confusion. The patient is nervous/anxious.     PE; Blood pressure 143/80, pulse 81, temperature 98.1 F (36.7 C), temperature source Oral, weight 202 lb (91.627 kg), last menstrual period 06/08/2014, SpO2 96 %.  BMI 32.6 Gen: Alert, well appearing.  Patient is oriented to person, place, time, and situation. AFFECT: pleasant, lucid thought and speech. ENT: Ears: EACs clear, normal epithelium.  TMs with good light reflex and landmarks bilaterally.  Eyes: no injection, icteris, swelling, or exudate.  EOMI, PERRLA. Nose: no drainage or turbinate edema/swelling.  No injection or focal lesion.  Mouth: lips without lesion/swelling.  Oral mucosa pink and moist.  Dentition intact and without obvious caries or gingival swelling.  Oropharynx without erythema, exudate, or swelling.  Neck: supple/nontender.  No LAD, mass, or TM.  Carotid pulses 2+ bilaterally, without bruits. CV: RRR, no m/r/g.   LUNGS: CTA bilat, nonlabored resps,  good aeration in all lung fields. ABD: soft, NT, ND, BS normal.  No hepatospenomegaly or mass.  No bruits. EXT: no clubbing, cyanosis, or edema.  Musculoskeletal: no joint swelling, erythema, warmth, or tenderness.  ROM of all joints intact. Skin - no sores or suspicious lesions or rashes or color changes   Pertinent labs:  Urine preg test today: NEG  ASSESSMENT AND PLAN:   1) Insomnia, chronic.  Worsened lately secondary to uncontrolled anxiety. Will start trial of clonazepam 0.23m, 1-2 qhs prn.  Therapeutic expectations and side effect profile of medication discussed today.  Patient's questions answered.  2) RLS: see above in #1.  3) GAD: increase zoloft to 574mqhs per pt request.  She'll likely need this med up-titrated for appropriate response so we'll  see if she will be amenable to this plan as time goes on.  4) CPE/Health maintenance exam:  Reviewed age and gender appropriate health maintenance issues (prudent diet, regular exercise, health risks of tobacco and excessive alcohol, use of seatbelts, fire alarms in home, use of sunscreen).  Also reviewed age and gender appropriate health screening as well as vaccine recommendations. Fasting HP labs today.  She declined HIV screening today.  She has had this in the past with her pregnancy: neg. GYN exam/screening coming up soon. Vacc's UTD.  An After Visit Summary was printed and given to the patient.  FOLLOW UP:  Return in about 2 weeks (around 08/17/2014) for 30 min visit musculo complaints.

## 2014-08-04 ENCOUNTER — Encounter: Payer: Self-pay | Admitting: Family Medicine

## 2014-08-04 ENCOUNTER — Other Ambulatory Visit: Payer: Self-pay | Admitting: Family Medicine

## 2014-08-09 ENCOUNTER — Encounter: Payer: Self-pay | Admitting: Family Medicine

## 2014-08-19 ENCOUNTER — Encounter: Payer: Self-pay | Admitting: Internal Medicine

## 2014-08-19 ENCOUNTER — Encounter: Payer: Self-pay | Admitting: Family Medicine

## 2014-08-19 ENCOUNTER — Ambulatory Visit (INDEPENDENT_AMBULATORY_CARE_PROVIDER_SITE_OTHER): Payer: BLUE CROSS/BLUE SHIELD | Admitting: Family Medicine

## 2014-08-19 VITALS — BP 104/80 | HR 73 | Temp 98.5°F | Ht 66.0 in | Wt 200.0 lb

## 2014-08-19 DIAGNOSIS — K5732 Diverticulitis of large intestine without perforation or abscess without bleeding: Secondary | ICD-10-CM

## 2014-08-19 DIAGNOSIS — K589 Irritable bowel syndrome without diarrhea: Secondary | ICD-10-CM

## 2014-08-19 NOTE — Progress Notes (Signed)
Pre visit review using our clinic review tool, if applicable. No additional management support is needed unless otherwise documented below in the visit note. 

## 2014-08-19 NOTE — Progress Notes (Signed)
OFFICE VISIT  08/19/2014   CC:  Chief Complaint  Patient presents with  . Follow-up    Discuss GI problems   HPI:    Patient is a 44 y.o. Caucasian female who presents for discussion of GI issues. Says she has ong hx of IBS, has had the whole process of testing done about 10 yrs ago and all was normal. Initially constip predom, then diarrhea predominant.  Lately she feels intermittent left sided abdominal pain, says on 3 occasions she actually got n/v/d and fever.  She did clear liquid diet during these periods and sx's would resolve on about 2 d of this.  Never sought medical eval during episode.   No blood in stool.  Currently no abd pain or fevers or n/v. Has her "usual" IBS sx's which are postprandial loose/urgent BM.  Sometimes up to 10 times a day.  No signif abd cramping with this.  Levsin was unhelpful.  She has never been on any other med for IBS, does not have a local GI MD.  Past Medical History  Diagnosis Date  . Gestational diabetes   . Allergic rhinitis   . Asthma     mild intermittent  . History of urinary tract infection   . GERD (gastroesophageal reflux disease)   . Anxiety and depression   . Irritable bowel syndrome (IBS)     mixed type  . Arthritis     great toes, thumbs  . Vitamin D deficiency     dx'd at [facility] 2014 per pt--she did not take any replacement vit D  . Obesity, Class I, BMI 30-34.9   . Hyperlipemia, mixed     Mild, improved w/out meds from 2015 to 2016  . Chronic insomnia     with RLS component + anxiety component contributing.    Past Surgical History  Procedure Laterality Date  . Cesarean section    . Colonoscopy  2005    normal per pt  . Esophagogastroduodenoscopy  2004    normal per pt    Outpatient Prescriptions Prior to Visit  Medication Sig Dispense Refill  . albuterol (PROVENTIL HFA;VENTOLIN HFA) 108 (90 BASE) MCG/ACT inhaler Inhale 2 puffs into the lungs every 6 (six) hours as needed for wheezing.    . calcium-vitamin D  (OSCAL WITH D) 500-200 MG-UNIT per tablet Take 1 tablet by mouth.    . clonazePAM (KLONOPIN) 0.5 MG tablet 1-2 tabs po qhs for restless legs syndrome and insomnia 60 tablet 1  . sertraline (ZOLOFT) 50 MG tablet Take 1 tablet (50 mg total) by mouth daily. 30 tablet 11   No facility-administered medications prior to visit.    Allergies  Allergen Reactions  . Penicillins     Reaction unknown    ROS As per HPI  PE: Blood pressure 104/80, pulse 73, temperature 98.5 F (36.9 C), temperature source Oral, height 5' 6"  (1.676 m), weight 200 lb (90.719 kg), last menstrual period 06/08/2014, SpO2 97 %.  Pt examined with April Miller, CMA, as chaperone. Gen: Alert, well appearing.  Patient is oriented to person, place, time, and situation. MOQ:HUTM: no injection, icteris, swelling, or exudate.  EOMI, PERRLA. Mouth: lips without lesion/swelling.  Oral mucosa pink and moist. Oropharynx without erythema, exudate, or swelling.  ABD: soft, NT/ND, BS normal.  NO HSM or mass or bruit.  LABS:  Lab Results  Component Value Date   TSH 2.60 08/03/2014   Lab Results  Component Value Date   WBC 9.2 08/03/2014   HGB  13.5 08/03/2014   HCT 40.0 08/03/2014   MCV 93.1 08/03/2014   PLT 319.0 08/03/2014   Lab Results  Component Value Date   CREATININE 0.68 08/03/2014   BUN 5* 08/03/2014   NA 135 08/03/2014   K 4.2 08/03/2014   CL 99 08/03/2014   CO2 30 08/03/2014   Lab Results  Component Value Date   ALT 29 08/03/2014   AST 20 08/03/2014   ALKPHOS 90 08/03/2014   BILITOT 0.6 08/03/2014   Lab Results  Component Value Date   CHOL 228* 08/03/2014   Lab Results  Component Value Date   HDL 43.40 08/03/2014   Lab Results  Component Value Date   LDLCALC 146* 08/03/2014   Lab Results  Component Value Date   TRIG 192.0* 08/03/2014   Lab Results  Component Value Date   CHOLHDL 5 08/03/2014    IMPRESSION AND PLAN:  1) IBS, diarrhea predominant.  Will refer to GI for consideration of  one of the newer meds that have come out to treat this.    2) Recurrent LLQ pains w/ fever, resolves with liquid diet. Suspect recurrent acute diverticulitis.  Current pt is well. Again, will ask GI to see, as she may be a candidate for a repeat of colonoscopy that was first done about 10 yrs ago. Signs/symptoms to call or return for were reviewed and pt expressed understanding.  An After Visit Summary was printed and given to the patient.  FOLLOW UP: Return for keep upcoming appt already set. (for discussion of musculoskeletal complaints)

## 2014-08-21 ENCOUNTER — Encounter: Payer: Self-pay | Admitting: Family Medicine

## 2014-08-21 ENCOUNTER — Ambulatory Visit (INDEPENDENT_AMBULATORY_CARE_PROVIDER_SITE_OTHER): Payer: BLUE CROSS/BLUE SHIELD | Admitting: Family Medicine

## 2014-08-21 VITALS — BP 110/70 | HR 91 | Temp 98.2°F | Ht 66.0 in | Wt 201.0 lb

## 2014-08-21 DIAGNOSIS — M79676 Pain in unspecified toe(s): Secondary | ICD-10-CM | POA: Diagnosis not present

## 2014-08-21 DIAGNOSIS — M2142 Flat foot [pes planus] (acquired), left foot: Secondary | ICD-10-CM

## 2014-08-21 DIAGNOSIS — M25531 Pain in right wrist: Secondary | ICD-10-CM

## 2014-08-21 DIAGNOSIS — M222X9 Patellofemoral disorders, unspecified knee: Secondary | ICD-10-CM | POA: Diagnosis not present

## 2014-08-21 DIAGNOSIS — M2141 Flat foot [pes planus] (acquired), right foot: Secondary | ICD-10-CM | POA: Diagnosis not present

## 2014-08-21 NOTE — Progress Notes (Signed)
Pre visit review using our clinic review tool, if applicable. No additional management support is needed unless otherwise documented below in the visit note. 

## 2014-08-21 NOTE — Progress Notes (Signed)
OFFICE VISIT  08/23/2014   CC: discuss musculo complaints HPI:    Patient is a 44 y.o. Caucasian female who presents to discuss musculoskeletal complaints.  Pain in both great toes: right one injured about 10 yrs ago with an odd injury in which she got her toe temporarily caught on something, x-ray showed some arthritis in IP joint exclusively.   Left one got jammed into a root while trail running about 9 yrs ago.  She did not seek medical care at that time. Was given a "shoe insert" for right toe prob and it was too uncomfortable to wear. Ever since then both toes intermittently, worse after lots of activity that causes her to flex her great toes a lot (IP joint is where pain is but it radiates up foot with certain movements).  This has led to impaired gait in which she doesn't flex toes fully and also tends to walk on outsides of her feet. No erythema, swelling, or warmth to toes involved or any other joints.  Secondly, around Christmas 2015 she recalls onset of paiin in proximal flexor mm's of forearm after she did a rapid and forcefull extension of her right elbow.  Since then it is exacerbated by fast movements of right arm or when trying to hold and move heavy objects with right arm.  No pain with forearm flexion as long as forearm is supinated.  No paresthesias.  No pain/swelling/erythema/warmth of the elbow itself.  Other joints: some intermittent bilat knee pains mainly when going up and down steps.  No swelling/eryth/warmth. Occ/intermittent pain in both thumbs with repeated periods of overuse.   No swelling.     Past Medical History  Diagnosis Date  . Gestational diabetes   . Allergic rhinitis   . Asthma     mild intermittent  . History of urinary tract infection   . GERD (gastroesophageal reflux disease)   . Anxiety and depression   . Irritable bowel syndrome (IBS)     mixed type  . Arthritis     great toes, thumbs  . Vitamin D deficiency     dx'd at [facility] 2014 per pt--she  did not take any replacement vit D  . Obesity, Class I, BMI 30-34.9   . Hyperlipemia, mixed     Mild, improved w/out meds from 2015 to 2016  . Chronic insomnia     with RLS component + anxiety component contributing.    Past Surgical History  Procedure Laterality Date  . Cesarean section    . Colonoscopy  2005    normal per pt  . Esophagogastroduodenoscopy  2004    normal per pt    Outpatient Prescriptions Prior to Visit  Medication Sig Dispense Refill  . albuterol (PROVENTIL HFA;VENTOLIN HFA) 108 (90 BASE) MCG/ACT inhaler Inhale 2 puffs into the lungs every 6 (six) hours as needed for wheezing.    . calcium-vitamin D (OSCAL WITH D) 500-200 MG-UNIT per tablet Take 1 tablet by mouth.    . clonazePAM (KLONOPIN) 0.5 MG tablet 1-2 tabs po qhs for restless legs syndrome and insomnia 60 tablet 1  . sertraline (ZOLOFT) 50 MG tablet Take 1 tablet (50 mg total) by mouth daily. 30 tablet 11   No facility-administered medications prior to visit.    Allergies  Allergen Reactions  . Penicillins     Reaction unknown    ROS As per HPI  PE: Blood pressure 110/70, pulse 91, temperature 98.2 F (36.8 C), temperature source Oral, height 5' 6"  (1.676  m), weight 201 lb (91.173 kg), last menstrual period 06/08/2014, SpO2 97 %. Gen: Alert, well appearing.  Patient is oriented to person, place, time, and situation. No swelling or erythema, or stiffness of any joints. ROM of all joints intact.  She has moderate pain when she tries to extend her great toes against resistance--the IP joint is where she notes the pain.  Mild TTP on dorsal aspect of IP joint of big toe on L foot. No laxity on valgus/varus stressing of great toe joints on either side. No heel or plantar foot tenderness.  No achilles tenderness.  Mild flexible pes planus bilaterally.  Normal ankle alignment.   Right arm with mild, vague discomfort to palpation in proximal aspect of forearm extensor complex.  No tenderness of lateral  epicondyle.  Pain with resisted R forearm pronation but not supination.  No pain with wrist flexion/extension passively or actively against resistance.  Fingers and thumbs with normal ROM and no tenderness, erythema, or warmth.  Finkelstein's neg bilat.  Knees: No swelling, erythema, or warmth.  No tenderness to palpation, no pain with patellar grind, patellar laxity wnl. No knee instability.  No popliteal mass/tenderness.  LABS:  None today  IMPRESSION AND PLAN:  1) Bilateral great toe post-traumatic ligamentous pain, intermittent, but occurring for years now. 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 Discussed 600 mg ibuprofen bid with food prn. Refer to PT at [facility].   If not improving with a few months of PT or if worsening with this then will ask Dr. Tamala Julian in Sports Medicine to see her.  2) Bilateral patellofemoral pain syndrome; mild.  Reassured pt.  3) Right forearm extensor mm strain vs atypical lateral epicondylitis.    An After Visit Summary was printed and given to the patient.  FOLLOW UP: Return in about 3 months (around 11/20/2014) for f/u musculoskeletal probs.

## 2014-09-10 ENCOUNTER — Encounter: Payer: Self-pay | Admitting: Family Medicine

## 2014-10-02 ENCOUNTER — Encounter: Payer: Self-pay | Admitting: Family Medicine

## 2014-10-14 ENCOUNTER — Other Ambulatory Visit: Payer: Self-pay | Admitting: *Deleted

## 2014-10-14 MED ORDER — CLONAZEPAM 0.5 MG PO TABS
ORAL_TABLET | ORAL | Status: DC
Start: 2014-10-14 — End: 2015-01-28

## 2014-10-14 NOTE — Telephone Encounter (Signed)
Fax from [facility]) requesting refill for Clonazepam. LOV 08/21/14, up coming ov 11/20/14, last written: 08/03/14 #60 w/ 1RF. Please advise. Thanks.

## 2014-10-16 ENCOUNTER — Ambulatory Visit (INDEPENDENT_AMBULATORY_CARE_PROVIDER_SITE_OTHER): Payer: BLUE CROSS/BLUE SHIELD | Admitting: Internal Medicine

## 2014-10-16 ENCOUNTER — Other Ambulatory Visit (INDEPENDENT_AMBULATORY_CARE_PROVIDER_SITE_OTHER): Payer: BLUE CROSS/BLUE SHIELD

## 2014-10-16 ENCOUNTER — Encounter: Payer: Self-pay | Admitting: Internal Medicine

## 2014-10-16 VITALS — BP 110/70 | HR 80 | Ht 66.0 in | Wt 199.8 lb

## 2014-10-16 DIAGNOSIS — K5732 Diverticulitis of large intestine without perforation or abscess without bleeding: Secondary | ICD-10-CM

## 2014-10-16 DIAGNOSIS — R197 Diarrhea, unspecified: Secondary | ICD-10-CM

## 2014-10-16 DIAGNOSIS — K529 Noninfective gastroenteritis and colitis, unspecified: Secondary | ICD-10-CM

## 2014-10-16 LAB — IGA: IgA: 194 mg/dL (ref 68–378)

## 2014-10-16 MED ORDER — NA SULFATE-K SULFATE-MG SULF 17.5-3.13-1.6 GM/177ML PO SOLN: ORAL | Status: DC

## 2014-10-16 NOTE — Patient Instructions (Signed)
  You have been scheduled for a colonoscopy. Please follow written instructions given to you at your visit today.  Please pick up your prep supplies at the pharmacy within the next 1-3 days. If you use inhalers (even only as needed), please bring them with you on the day of your procedure.  Your physician has requested that you go to the basement for the lab work before leaving today.   Use Imodium over the counter as needed for diarrhea.   I appreciate the opportunity to care for you.

## 2014-10-16 NOTE — Progress Notes (Signed)
Patient ID: Nancy Adams, female   DOB: 14-Feb-1971, 44 y.o.   MRN: [id number] HPI: Nancy Adams is a 44 yo female with PMH of IBS, mild asthma, hyperlipidemia, chronic insomnia who is seen in consultation at the request of Dr. Ernestine Conrad to address chronic diarrhea and possible diverticulitis. She is here alone today. She reports she was diagnosed with irritable bowel around 2003 and worked up extensively by gastroenterologist in [place]. This workup included upper and lower endoscopy, what sounds like a CT enteroscopy and a food study. She was diagnosed with IBS. Initially her IBS was constipation predominant then she got pregnant with 20s 8 years ago and had no IBS symptoms during pregnancy or for 3 years after. When IBS returned it was diarrhea and constipation and then for the last 6 months it's been considerable diarrhea. She reports 6 months of diarrhea "out-of-control". Diarrhea occurs 4-10 times daily. It is watery and never formed. Not associated with abdominal pain. Is associated with urgency and several accidents. Not nocturnal. No fevers or chills. No blood in her stool or melena. Associated with abdominal bloating. 3 times in the past year she is developed left lower quadrant abdominal pain and fever. She self diagnosed diverticulitis by reading Internet but herself on a clear liquid diet and symptoms resolved after a few days. Never treated with antibiotics. She denies a family history of celiac disease, IBD or GI tract malignancy. She is married and a stay-at-home mom. 2 twin boys who just turned 8.  Past Medical History  Diagnosis Date  . Gestational diabetes   . Allergic rhinitis   . Asthma     mild intermittent  . History of urinary tract infection   . GERD (gastroesophageal reflux disease)   . Anxiety and depression   . Irritable bowel syndrome (IBS)     mixed type  . Arthritis     great toes, thumbs  . Vitamin D deficiency     dx'd at [facility] 2014 per pt--she did not take any  replacement vit D  . Obesity, Class I, BMI 30-34.9   . Hyperlipemia, mixed     Mild, improved w/out meds from 2015 to 2016  . Chronic insomnia     with RLS component + anxiety component contributing.  . DUB (dysfunctional uterine bleeding) 2016    GYN: likely anovulatory, unremarkable pelvic u/s 09/2014.  Pt started on cyclic progesterone (403KV qd x 12d every 60d prn if no spontaneous cycle-Dr. Rivard)  . Menopausal flushing   . Female fertility problem     Past Surgical History  Procedure Laterality Date  . Cesarean section    . Colonoscopy  2005    normal per pt  . Esophagogastroduodenoscopy  2004    normal per pt    Outpatient Prescriptions Prior to Visit  Medication Sig Dispense Refill  . albuterol (PROVENTIL HFA;VENTOLIN HFA) 108 (90 BASE) MCG/ACT inhaler Inhale 2 puffs into the lungs every 6 (six) hours as needed for wheezing.    . calcium-vitamin D (OSCAL WITH D) 500-200 MG-UNIT per tablet Take 1 tablet by mouth.    . clonazePAM (KLONOPIN) 0.5 MG tablet 1-2 tabs po qhs for restless legs syndrome and insomnia 60 tablet 2  . sertraline (ZOLOFT) 50 MG tablet Take 1 tablet (50 mg total) by mouth daily. 30 tablet 11   No facility-administered medications prior to visit.    Allergies  Allergen Reactions  . Penicillins     Reaction unknown    Family History  Problem Relation  Age of Onset  . Arthritis Mother   . Allergic rhinitis Mother   . Heart disease Father 50  . Hypertension Father   . Asthma Sister   . Allergic rhinitis Sister   . Asthma Brother   . Allergic rhinitis Brother   . Heart disease Paternal Grandmother   . Cancer Paternal Grandmother     leukemia, lived to age 60  . Diabetes Neg Hx   . Stroke Neg Hx   . Colon cancer Neg Hx   . Colon polyps Neg Hx   . Crohn's disease Neg Hx     History  Substance Use Topics  . Smoking status: Never Smoker   . Smokeless tobacco: Never Used  . Alcohol Use: 0.0 oz/week     Comment: socially    ROS: As per  history of present illness, otherwise negative  BP 110/70 mmHg  Pulse 80  Ht 5' 6"  (1.676 m)  Wt 199 lb 12.8 oz (90.629 kg)  BMI 32.26 kg/m2  LMP 09/16/2014 (Exact Date) Constitutional: Well-developed and well-nourished. No distress. HEENT: Normocephalic and atraumatic. Oropharynx is clear and moist. No oropharyngeal exudate. Conjunctivae are normal.  No scleral icterus. Neck: Neck supple. Trachea midline. Cardiovascular: Normal rate, regular rhythm and intact distal pulses. No M/R/G Pulmonary/chest: Effort normal and breath sounds normal. No wheezing, rales or rhonchi. Abdominal: Soft, nontender, nondistended. Bowel sounds active throughout. There are no masses palpable. No hepatosplenomegaly. Extremities: no clubbing, cyanosis, or edema Lymphadenopathy: No cervical adenopathy noted. Neurological: Alert and oriented to person place and time. Skin: Skin is warm and dry. No rashes noted. Psychiatric: Normal mood and affect. Behavior is normal.  RELEVANT LABS AND IMAGING: CBC    Component Value Date/Time   WBC 9.2 08/03/2014 1033   RBC 4.30 08/03/2014 1033   HGB 13.5 08/03/2014 1033   HCT 40.0 08/03/2014 1033   PLT 319.0 08/03/2014 1033   MCV 93.1 08/03/2014 1033   MCH 30.6 07/23/2012 1520   MCHC 33.8 08/03/2014 1033   RDW 13.1 08/03/2014 1033   LYMPHSABS 1.9 08/03/2014 1033   MONOABS 0.5 08/03/2014 1033   EOSABS 0.3 08/03/2014 1033   BASOSABS 0.0 08/03/2014 1033    CMP     Component Value Date/Time   NA 135 08/03/2014 1033   K 4.2 08/03/2014 1033   CL 99 08/03/2014 1033   CO2 30 08/03/2014 1033   GLUCOSE 97 08/03/2014 1033   BUN 5* 08/03/2014 1033   CREATININE 0.68 08/03/2014 1033   CREATININE 0.74 07/23/2012 1520   CALCIUM 9.4 08/03/2014 1033   PROT 7.3 08/03/2014 1033   ALBUMIN 4.2 08/03/2014 1033   AST 20 08/03/2014 1033   ALT 29 08/03/2014 1033   ALKPHOS 90 08/03/2014 1033   BILITOT 0.6 08/03/2014 1033   Lab Results  Component Value Date   TSH 2.60  08/03/2014    ASSESSMENT/PLAN: 44 yo female with PMH of IBS, mild asthma, hyperlipidemia, chronic insomnia who is seen in consultation at the request of Dr. Ernestine Conrad to address chronic diarrhea and possible diverticulitis.  1. Chronic diarrhea/possible diverticulitis not currently active -- possibly IBS with diarrhea predominance. Need to exclude celiac disease, microscopic colitis, IBD and infection.  Celiac panel, GI pathogen panel, fecal leukocytes and fecal elastase. Colonoscopy. Procedure discussed including the risks, benefits and alternatives and she is agreeable to proceed. If stool studies negative can use Imodium per box instruction while working up the problem. If workup reveals IBS with treat with rifaximin, if unsuccessful then consider Lotronex versus  Viberzi.   UX:[facility] [address], [address] [place], [place] [address]

## 2014-10-19 LAB — TISSUE TRANSGLUTAMINASE, IGA: Tissue Transglutaminase Ab, IgA: 1 U/mL (ref <4)

## 2014-11-03 ENCOUNTER — Other Ambulatory Visit: Payer: BLUE CROSS/BLUE SHIELD

## 2014-11-03 DIAGNOSIS — K529 Noninfective gastroenteritis and colitis, unspecified: Secondary | ICD-10-CM

## 2014-11-03 DIAGNOSIS — R197 Diarrhea, unspecified: Secondary | ICD-10-CM

## 2014-11-05 LAB — FECAL LACTOFERRIN, QUANT: Lactoferrin: NEGATIVE

## 2014-11-11 ENCOUNTER — Other Ambulatory Visit: Payer: Self-pay

## 2014-11-11 LAB — PANCREATIC ELASTASE, FECAL: Pancreatic Elastase-1, Stool: 85 mcg/g — ABNORMAL LOW

## 2014-11-11 MED ORDER — PANCRELIPASE (LIP-PROT-AMYL) 24000-76000 UNITS PO CPEP: 24000.0000 [IU] | ORAL_CAPSULE | ORAL | Status: DC

## 2014-11-12 ENCOUNTER — Telehealth: Payer: Self-pay

## 2014-11-12 NOTE — Telephone Encounter (Signed)
Nancy Adams with [facility] called to clarify the dosage of Pancrealipase for patient.  I checked with Rosanne Sack and relayed the correct information:  2 capsules with each meal and 1 with a snack= 7 total per day.  She acknowledged and understood and will fill the rx accordingly

## 2014-11-16 LAB — OTHER SOLSTAS TEST

## 2014-11-20 ENCOUNTER — Encounter: Payer: Self-pay | Admitting: Family Medicine

## 2014-11-20 ENCOUNTER — Ambulatory Visit (INDEPENDENT_AMBULATORY_CARE_PROVIDER_SITE_OTHER): Payer: BLUE CROSS/BLUE SHIELD | Admitting: Family Medicine

## 2014-11-20 VITALS — BP 118/85 | HR 66 | Temp 97.6°F | Resp 16 | Ht 66.0 in | Wt 198.0 lb

## 2014-11-20 DIAGNOSIS — M75102 Unspecified rotator cuff tear or rupture of left shoulder, not specified as traumatic: Secondary | ICD-10-CM

## 2014-11-20 DIAGNOSIS — M79676 Pain in unspecified toe(s): Secondary | ICD-10-CM

## 2014-11-20 DIAGNOSIS — F411 Generalized anxiety disorder: Secondary | ICD-10-CM

## 2014-11-20 DIAGNOSIS — M7542 Impingement syndrome of left shoulder: Secondary | ICD-10-CM

## 2014-11-20 DIAGNOSIS — G2581 Restless legs syndrome: Secondary | ICD-10-CM | POA: Diagnosis not present

## 2014-11-20 DIAGNOSIS — K868 Other specified diseases of pancreas: Secondary | ICD-10-CM | POA: Diagnosis not present

## 2014-11-20 DIAGNOSIS — K8681 Exocrine pancreatic insufficiency: Secondary | ICD-10-CM

## 2014-11-20 NOTE — Progress Notes (Signed)
Pre visit review using our clinic review tool, if applicable. No additional management support is needed unless otherwise documented below in the visit note. 

## 2014-11-20 NOTE — Progress Notes (Signed)
OFFICE NOTE  11/20/2014  CC:  Chief Complaint  Patient presents with  . Follow-up    3 month f/u. Pt is not fasting.    HPI: Patient is a 44 y.o. 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  She finished PT and is happy that she feels much improved, doesn't alter her foot stance anymore when walking.  Also says her knees-patellofemoral pain-no longer bothers her since getting PT for her toes and she is no longer altering her gait.  Her right forearm injury got better wearing a tennis elbow strap.  GI P see PMH below.  Diarrhea gone since taking pancreatic enzymes with each meal.  She has f/u appt with Dr. Hilarie Fredrickson soon.  Says anxiety and RLS doing well on zoloft and clonazepam, respectively.  Has had about 3 mo of left shoulder pain with ABduction and IR/ER, some impingement signs.  No injury/trauma prior. Taking no meds.  She feels like it is gradually getting better the last month or so.  She is not interested in a steroid injection or PT for this.   Pertinent PMH:  Past medical, surgical, social, and family history reviewed and changes are noted since last office visit (pancreatic insufficiency, started pancreolipase).  MEDS:  Outpatient Prescriptions Prior to Visit  Medication Sig Dispense Refill  . albuterol (PROVENTIL HFA;VENTOLIN HFA) 108 (90 BASE) MCG/ACT inhaler Inhale 2 puffs into the lungs every 6 (six) hours as needed for wheezing.    . calcium-vitamin D (OSCAL WITH D) 500-200 MG-UNIT per tablet Take 1 tablet by mouth.    . clonazePAM (KLONOPIN) 0.5 MG tablet 1-2 tabs po qhs for restless legs syndrome and insomnia 60 tablet 2  . Pancrelipase, Lip-Prot-Amyl, 24000 UNITS CPEP Take 1 capsule (24,000 Units total) by mouth as directed. 100 capsule 0  . sertraline (ZOLOFT) 50 MG tablet Take 1 tablet (50 mg total) by mouth daily. 30 tablet 11  . hyoscyamine (LEVSIN, ANASPAZ) 0.125 MG  tablet Take 0.125 mg by mouth every 4 (four) hours as needed.    . Na Sulfate-K Sulfate-Mg Sulf (SUPREP BOWEL PREP) SOLN Use as directed 2 Bottle 0   No facility-administered medications prior to visit.    PE: Blood pressure 118/85, pulse 66, temperature 97.6 F (36.4 C), temperature source Oral, resp. rate 16, height 5' 6"  (1.676 m), weight 198 lb (89.812 kg), last menstrual period 10/21/2014, SpO2 98 %. Gen: Alert, well appearing.  Patient is oriented to person, place, time, and situation. L shoulder with ROM intact but mild pain with aBduction between 30 and 180 deg.  IR/ER are fine.   Mild TTP around hook of acromion.  NO AC joint tenderness.  O'brien's neg.  Neg drop sign.  IMPRESSION AND PLAN:  1) Musculoskeletal complaints, improving: great toes, knees, and R forearm all improved/resolved problems. Newest problem of L shoulder impingement syndrome is mild, ok for watchful waiting approach.  2) GI issues: hx of functional GI sx's + recently dx'd with pancreatic insufficiency.  Responding well to pancreatic enzymes, has colonoscopy coming up and then another f/u with Dr. Hilarie Fredrickson.  3) RLS + insomnia: doing well on clonazepam.  4) Anxiety: doing well on sertraline at 32m qd.  An After Visit Summary was printed and given to the patient.  FOLLOW UP: 1 yr

## 2014-11-24 ENCOUNTER — Encounter: Payer: Self-pay | Admitting: Internal Medicine

## 2014-11-24 ENCOUNTER — Ambulatory Visit (AMBULATORY_SURGERY_CENTER): Payer: BLUE CROSS/BLUE SHIELD | Admitting: Internal Medicine

## 2014-11-24 VITALS — BP 107/67 | HR 59 | Temp 98.4°F | Resp 19 | Ht 66.0 in | Wt 199.0 lb

## 2014-11-24 DIAGNOSIS — K529 Noninfective gastroenteritis and colitis, unspecified: Secondary | ICD-10-CM | POA: Diagnosis not present

## 2014-11-24 DIAGNOSIS — R197 Diarrhea, unspecified: Secondary | ICD-10-CM

## 2014-11-24 MED ORDER — SODIUM CHLORIDE 0.9 % IV SOLN: 500.0000 mL | INTRAVENOUS | Status: DC

## 2014-11-24 NOTE — Progress Notes (Signed)
Transferred to recovery room. A/O x3, pleased with MAC.  VSS.  Report to [facility], Therapist, sports.

## 2014-11-24 NOTE — Patient Instructions (Signed)
YOU HAD AN ENDOSCOPIC PROCEDURE TODAY AT [facility]:   Refer to the procedure report that was given to you for any specific questions about what was found during the examination.  If the procedure report does not answer your questions, please call your gastroenterologist to clarify.  If you requested that your care partner not be given the details of your procedure findings, then the procedure report has been included in a sealed envelope for you to review at your convenience later.  YOU SHOULD EXPECT: Some feelings of bloating in the abdomen. Passage of more gas than usual.  Walking can help get rid of the air that was put into your GI tract during the procedure and reduce the bloating. If you had a lower endoscopy (such as a colonoscopy or flexible sigmoidoscopy) you may notice spotting of blood in your stool or on the toilet paper. If you underwent a bowel prep for your procedure, you may not have a normal bowel movement for a few days.  Please Note:  You might notice some irritation and congestion in your nose or some drainage.  This is from the oxygen used during your procedure.  There is no need for concern and it should clear up in a day or so.  SYMPTOMS TO REPORT IMMEDIATELY:   Following lower endoscopy (colonoscopy or flexible sigmoidoscopy):  Excessive amounts of blood in the stool  Significant tenderness or worsening of abdominal pains  Swelling of the abdomen that is new, acute  Fever of 100F or higher    For urgent or emergent issues, a gastroenterologist can be reached at any hour by calling [phone].   DIET: Your first meal following the procedure should be a small meal and then it is ok to progress to your normal diet. Heavy or fried foods are harder to digest and may make you feel nauseous or bloated.  Likewise, meals heavy in dairy and vegetables can increase bloating.  Drink plenty of fluids but you should avoid alcoholic beverages for 24  hours.  ACTIVITY:  You should plan to take it easy for the rest of today and you should NOT DRIVE or use heavy machinery until tomorrow (because of the sedation medicines used during the test).    FOLLOW UP: Our staff will call the number listed on your records the next business day following your procedure to check on you and address any questions or concerns that you may have regarding the information given to you following your procedure. If we do not reach you, we will leave a message.  However, if you are feeling well and you are not experiencing any problems, there is no need to return our call.  We will assume that you have returned to your regular daily activities without incident.  If any biopsies were taken you will be contacted by phone or by letter within the next 1-3 weeks.  Please call us at [phone] if you have not heard about the biopsies in 3 weeks.    SIGNATURES/CONFIDENTIALITY: You and/or your care partner have signed paperwork which will be entered into your electronic medical record.  These signatures attest to the fact that that the information above on your After Visit Summary has been reviewed and is understood.  Full responsibility of the confidentiality of this discharge information lies with you and/or your care-partner.   RESUME MEDICATIONS. FOLLOW UP WITH  DR. PYRTLE NEXT AVAILABLE APPOINTMENT.

## 2014-11-24 NOTE — Op Note (Signed)
[place]  [address]. [place], [address]   COLONOSCOPY PROCEDURE REPORT  PATIENT: Nancy Adams, Nancy Adams  MR#: [id number] BIRTHDATE: 15-Dec-1970 , 43  yrs. old GENDER: female ENDOSCOPIST: Jerene Bears, MD REFERRED WS:[id number] Anitra Lauth, M.D. PROCEDURE DATE:  11/24/2014 PROCEDURE:   Colonoscopy, diagnostic and Colonoscopy with biopsy First Screening Colonoscopy - Avg.  risk and is 50 yrs.  old or older - No.  Prior Negative Screening - Now for repeat screening. N/A  History of Adenoma - Now for follow-up colonoscopy & has been > or = to 3 yrs.  N/A  Polyps removed today? No ASA CLASS:   Class II INDICATIONS:chronic diarrhea. MEDICATIONS: Monitored anesthesia care and Propofol 350 mg IV  DESCRIPTION OF PROCEDURE:   After the risks benefits and alternatives of the procedure were thoroughly explained, informed consent was obtained.  The digital rectal exam revealed no rectal mass.   The LB [id number]  endoscope was introduced through the anus and advanced to the cecum, which was identified by both the appendix and ileocecal valve. No adverse events experienced. The quality of the prep was good.  (Suprep was used)  The instrument was then slowly withdrawn as the colon was fully examined. Estimated blood loss is zero unless otherwise noted in this procedure report.      COLON FINDINGS: A normal appearing cecum, ileocecal valve, and appendiceal orifice were identified.  The ascending, transverse, descending, sigmoid colon, and rectum appeared unremarkable. Multiple random biopsies were performed using cold forceps. Samples were sent to R/O microscopic colitis.  Retroflexed views revealed no abnormalities. The time to cecum = 9.5 Withdrawal time = 8.3   The scope was withdrawn and the procedure completed.  COMPLICATIONS: There were no immediate complications.  ENDOSCOPIC IMPRESSION: Normal colonoscopy; multiple random biopsies were performed using cold  forceps  RECOMMENDATIONS: 1.  Await biopsy results 2.  Continue pancreatic enzyme replacement replacement 3.  Office follow-up with me next available  eSigned:  Jerene Bears, MD 11/24/2014 3:50 PM   cc: Ricardo Jericho, MD and The Patient

## 2014-11-24 NOTE — Progress Notes (Signed)
Called to room to assist during endoscopic procedure.  Patient ID and intended procedure confirmed with present staff. Received instructions for my participation in the procedure from the performing physician.  

## 2014-11-25 ENCOUNTER — Telehealth: Payer: Self-pay | Admitting: *Deleted

## 2014-11-25 ENCOUNTER — Telehealth: Payer: Self-pay

## 2014-11-25 NOTE — Telephone Encounter (Signed)
No answer, message left for the patient.

## 2014-11-25 NOTE — Telephone Encounter (Signed)
Left message for pt to call back  °

## 2014-11-25 NOTE — Telephone Encounter (Signed)
-----   Message from Jerene Bears, MD sent at 11/25/2014  8:31 AM EDT ----- Regarding: FW: Panc question Pls let pt know that I consulted with Dr. Ardis Hughs and we recommend CT abd/pelvis with IV contrast pancreas protocol  DX: pancreas insufficiency Thanks JMP  ----- Message -----    From: Milus Banister, MD    Sent: 11/25/2014   7:23 AM      To: Jerene Bears, MD Subject: RE: Jacqulyn Liner, I would definitely image the pancreas.  CT scan, pancreatic protocol ought to be fine.  DJ   ----- Message -----    From: Jerene Bears, MD    Sent: 11/24/2014   3:19 PM      To: Milus Banister, MD Subject: Panc question                                  Melissa Montane I recently evaluated a young, 75, yo female with chronic diarrhea. Ends up as panc insuff with very low fecal elastase Responded to pancreatic enzyme replacement. No sure why she would have panc insuff.  Uptodate does not suggest further investigation, but I wanted to ask you. Do you think the diagnosis alone warrants cross-sectional imaging of her pancreas?   Thanks for your opinion Ulice Dash

## 2014-11-25 NOTE — Telephone Encounter (Signed)
Left message to call back  

## 2014-11-30 ENCOUNTER — Other Ambulatory Visit: Payer: Self-pay

## 2014-11-30 DIAGNOSIS — K8689 Other specified diseases of pancreas: Secondary | ICD-10-CM

## 2014-11-30 NOTE — Telephone Encounter (Signed)
Pt scheduled for CT of a/p, pancreatic protocol at [facility] CT 12/02/14@10 :30am, pt to arrive there at 10am. Pt aware of appt.

## 2014-12-01 ENCOUNTER — Encounter: Payer: Self-pay | Admitting: Internal Medicine

## 2014-12-02 ENCOUNTER — Ambulatory Visit (INDEPENDENT_AMBULATORY_CARE_PROVIDER_SITE_OTHER)
Admission: RE | Admit: 2014-12-02 | Discharge: 2014-12-02 | Disposition: A | Discharge status: Home / Self Care | Payer: BLUE CROSS/BLUE SHIELD | Source: Ambulatory Visit | Attending: Internal Medicine | Admitting: Internal Medicine

## 2014-12-02 DIAGNOSIS — K8689 Other specified diseases of pancreas: Secondary | ICD-10-CM

## 2014-12-02 DIAGNOSIS — K868 Other specified diseases of pancreas: Secondary | ICD-10-CM

## 2015-01-28 ENCOUNTER — Other Ambulatory Visit: Payer: Self-pay | Admitting: *Deleted

## 2015-01-28 MED ORDER — CLONAZEPAM 0.5 MG PO TABS: ORAL_TABLET | ORAL | Status: DC

## 2015-01-28 NOTE — Telephone Encounter (Signed)
RF request for clonazepam LOV: 11/20/14 Next ov: None Last written: 10/14/14 #60 w/ 2RF Please advise. Thanks.

## 2015-02-01 ENCOUNTER — Ambulatory Visit: Payer: BLUE CROSS/BLUE SHIELD | Admitting: Internal Medicine

## 2015-02-09 ENCOUNTER — Encounter: Payer: Self-pay | Admitting: Internal Medicine

## 2015-02-09 ENCOUNTER — Ambulatory Visit (INDEPENDENT_AMBULATORY_CARE_PROVIDER_SITE_OTHER): Payer: BLUE CROSS/BLUE SHIELD | Admitting: Internal Medicine

## 2015-02-09 VITALS — BP 92/74 | HR 72 | Ht 66.0 in | Wt 196.2 lb

## 2015-02-09 DIAGNOSIS — K529 Noninfective gastroenteritis and colitis, unspecified: Secondary | ICD-10-CM | POA: Diagnosis not present

## 2015-02-09 DIAGNOSIS — K8689 Other specified diseases of pancreas: Secondary | ICD-10-CM | POA: Diagnosis not present

## 2015-02-09 MED ORDER — PANCRELIPASE (LIP-PROT-AMYL) 24000-76000 UNITS PO CPEP: ORAL_CAPSULE | ORAL | Status: DC

## 2015-02-09 NOTE — Patient Instructions (Signed)
Continue Creon 24,000 take 3 with large meal or large fat containing meals, take 2 with Medium sized meals and 1 with snacks daily Use food diary daily for loose stools

## 2015-02-09 NOTE — Progress Notes (Signed)
Subjective:    Patient ID: Nancy Adams, female    DOB: 11/20/1970, 44 y.o.   MRN: [id number]  HPI Nancy Adams is a 44 year old female with past medical history of IBS, asthma, hyperlipidemia who was seen initially in July to evaluate chronic diarrhea. She was evaluated with a colonoscopy which was unremarkable including biopsies negative for microscopic colitis. Stool studies revealed a low fecal elastase and she was started on Creon. She has been taking 2 capsules with meals. 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 However she still says that it is "hit or miss". She has predominately loose stools more significant in the morning but other days there is more normal stool and yet other days she feels more constipated like she needs to have a bowel movement but cannot. Doesn't seem to change that much with diet. She does eat fairly healthy but admits to eating out on occasion. No blood in her stool or melena. Some mid and lower abdominal cramping on occasion with the loose stools but abdominal pain is not a predominant component. No new upper GI complaint and no hepatobiliary complaint. She is currently on her period. Of note prior to her colonoscopy she had a prolonged menstrual cycle lasting 2 weeks and during this time the diarrhea completely resolved. She's had 2 subsequent more normal 7 day cycles with no change in her bowel frequency.   Review of Systems As per history of present illness, otherwise negative  Current Medications, Allergies, Past Medical History, Past Surgical History, Family History and Social History were reviewed in [employer] record.     Objective:   Physical Exam BP 92/74 mmHg  Pulse 72  Ht 5' 6"  (1.676 m)  Wt 196 lb 3.2 oz (88.996 kg)  BMI 31.68 kg/m2  LMP 02/05/2015 Constitutional: Well-developed and  well-nourished. No distress. HEENT: Normocephalic and atraumatic. Marland Kitchen Conjunctivae are normal.  No scleral icterus. Neck: Neck supple. Trachea midline. Cardiovascular: Normal rate, regular rhythm and intact distal pulses.  Pulmonary/chest: Effort normal and breath sounds normal. No wheezing, rales or rhonchi. Abdominal: Soft, nontender, nondistended. Bowel sounds active throughout. There are no masses palpable. Extremities: no clubbing, cyanosis, or edema Lymphadenopathy: No cervical adenopathy noted. Neurological: Alert and oriented to person place and time. Skin: Skin is warm and dry. No rashes noted. Psychiatric: Normal mood and affect. Behavior is normal.  Labs and colonoscopy reviewed today     CLINICAL DATA:  Pancreatic insufficiency, diarrhea x2 months   EXAM: CT ABDOMEN WITHOUT AND WITH CONTRAST   TECHNIQUE: Multidetector CT imaging of the abdomen was performed following the standard protocol before and following the bolus administration of intravenous contrast.   CONTRAST:  100 mL Omnipaque 300 IV   COMPARISON:  None.   FINDINGS: Lower chest:  Lung bases are clear.   Hepatobiliary: Liver is within normal limits. No suspicious/enhancing hepatic lesions.   Gallbladder is unremarkable. No intrahepatic or extrahepatic ductal dilatation.   Pancreas: Within normal limits. No pancreatic mass, atrophy, or ductal dilatation.   Spleen: Within normal limits.   Adrenals/Urinary Tract: Adrenal glands are within normal limits.   1.8 x 1.3 cm mildly complex cyst in the posterior left upper kidney, with a single thin septation which is partially calcified, but without enhancement or mural nodularity, benign (Bosniak II). No suspicious/enhancing renal lesions. No renal calculi or hydronephrosis.   Stomach/Bowel: Stomach is within  normal limits.   Visualized bowel is unremarkable.   Vascular/Lymphatic: No evidence of abdominal aortic aneurysm.   No suspicious abdominal  lymphadenopathy.   Other: No abdominal ascites.   Musculoskeletal: Visualized osseous structures are within normal limits.   IMPRESSION: Pancreas is within normal limits.   1.8 x 1.3 cm mildly complex cyst in the posterior left upper kidney, benign (Bosniak II).   Please note that the pelvis was not imaged.     Electronically Signed   By: Julian Hy M.D.   On: 12/02/2014 13:35        Assessment & Plan:  44 year old female with past medical history of IBS, asthma, hyperlipidemia who is seen in follow-up after diagnosis of pancreatic insufficiency diagnosed by low fecal elastase  1. Chronic loose stools/diarrhea and panc insuff -- she has improved some but not fully. We discussed dietary fat intake and how this can change bowel habits. Perhaps she is not quite taking enough pancreatic enzyme to totally improve loose stools. I will have her increase her Creon to 3 capsules with large or high fat containing meals. Continue 1-2 with snacks and 2 tablets with low-fat meals. She will keep a food and bowel movement terminal to see about correlation. If symptoms do not improve fully with change in dose of Creon, she will stop the medicine for 2 weeks to see if diarrhea and loose stools come back completely as before diagnosis. She feels the need to convince herself that Creon is helping, which I understand. There may be an overlap between an element of pancreas insufficiency and IBS. She will let me know if she finds a trend with her food and bowel movement journal and also being off Creon if it comes to that. Follow-up in 3 months, sooner if necessary  25 min spent with pt today

## 2015-04-19 ENCOUNTER — Encounter: Payer: Self-pay | Admitting: Internal Medicine

## 2015-07-07 DIAGNOSIS — R7301 Impaired fasting glucose: Secondary | ICD-10-CM

## 2015-07-07 HISTORY — DX: Impaired fasting glucose: R73.01

## 2015-08-04 ENCOUNTER — Encounter: Payer: Self-pay | Admitting: Family Medicine

## 2015-08-04 ENCOUNTER — Ambulatory Visit (INDEPENDENT_AMBULATORY_CARE_PROVIDER_SITE_OTHER): Payer: BLUE CROSS/BLUE SHIELD | Admitting: Family Medicine

## 2015-08-04 VITALS — BP 108/75 | HR 67 | Temp 97.1°F | Resp 16 | Ht 65.5 in | Wt 197.2 lb

## 2015-08-04 DIAGNOSIS — E559 Vitamin D deficiency, unspecified: Secondary | ICD-10-CM

## 2015-08-04 DIAGNOSIS — L723 Sebaceous cyst: Secondary | ICD-10-CM | POA: Diagnosis not present

## 2015-08-04 DIAGNOSIS — Z0000 Encounter for general adult medical examination without abnormal findings: Secondary | ICD-10-CM

## 2015-08-04 DIAGNOSIS — R7989 Other specified abnormal findings of blood chemistry: Secondary | ICD-10-CM | POA: Diagnosis not present

## 2015-08-04 DIAGNOSIS — L309 Dermatitis, unspecified: Secondary | ICD-10-CM

## 2015-08-04 LAB — COMPREHENSIVE METABOLIC PANEL
ALBUMIN: 4.3 g/dL (ref 3.5–5.2)
ALT: 22 U/L (ref 0–35)
AST: 18 U/L (ref 0–37)
Alkaline Phosphatase: 80 U/L (ref 39–117)
BUN: 9 mg/dL (ref 6–23)
CHLORIDE: 100 meq/L (ref 96–112)
CO2: 29 mEq/L (ref 19–32)
Calcium: 9.6 mg/dL (ref 8.4–10.5)
Creatinine, Ser: 0.65 mg/dL (ref 0.40–1.20)
GFR: 105.41 mL/min (ref >60.00)
Glucose, Bld: 108 mg/dL — ABNORMAL HIGH (ref 70–99)
POTASSIUM: 4.2 meq/L (ref 3.5–5.1)
SODIUM: 137 meq/L (ref 135–145)
Total Bilirubin: 0.6 mg/dL (ref 0.2–1.2)
Total Protein: 7.3 g/dL (ref 6.0–8.3)

## 2015-08-04 LAB — CBC WITH DIFFERENTIAL/PLATELET
Basophils Absolute: 0 10*3/uL (ref 0.0–0.1)
Basophils Relative: 0.4 % (ref 0.0–3.0)
EOS PCT: 2.5 % (ref 0.0–5.0)
Eosinophils Absolute: 0.2 10*3/uL (ref 0.0–0.7)
HCT: 40.6 % (ref 36.0–46.0)
HEMOGLOBIN: 13.8 g/dL (ref 12.0–15.0)
Lymphocytes Relative: 25.8 % (ref 12.0–46.0)
Lymphs Abs: 1.6 10*3/uL (ref 0.7–4.0)
MCHC: 34 g/dL (ref 30.0–36.0)
MCV: 92.5 fl (ref 78.0–100.0)
MONO ABS: 0.3 10*3/uL (ref 0.1–1.0)
MONOS PCT: 4.7 % (ref 3.0–12.0)
Neutro Abs: 4.2 10*3/uL (ref 1.4–7.7)
Neutrophils Relative %: 66.6 % (ref 43.0–77.0)
Platelets: 317 10*3/uL (ref 150.0–400.0)
RBC: 4.39 Mil/uL (ref 3.87–5.11)
RDW: 12.9 % (ref 11.5–15.5)
WBC: 6.4 10*3/uL (ref 4.0–10.5)

## 2015-08-04 LAB — LIPID PANEL
Cholesterol: 237 mg/dL — ABNORMAL HIGH (ref 0–200)
HDL: 49.4 mg/dL (ref >39.00)
NonHDL: 187.85
TRIGLYCERIDES: 212 mg/dL — AB (ref 0.0–149.0)
Total CHOL/HDL Ratio: 5
VLDL: 42.4 mg/dL — AB (ref 0.0–40.0)

## 2015-08-04 LAB — TSH: TSH: 1.35 u[IU]/mL (ref 0.35–4.50)

## 2015-08-04 LAB — LDL CHOLESTEROL, DIRECT: Direct LDL: 160 mg/dL

## 2015-08-04 LAB — VITAMIN D 25 HYDROXY (VIT D DEFICIENCY, FRACTURES): VITD: 16.61 ng/mL — AB (ref 30.00–100.00)

## 2015-08-04 MED ORDER — FLUTICASONE PROPIONATE 0.05 % EX CREA: TOPICAL_CREAM | Freq: Two times a day (BID) | CUTANEOUS | Status: DC

## 2015-08-04 NOTE — Progress Notes (Signed)
Pre visit review using our clinic review tool, if applicable. No additional management support is needed unless otherwise documented below in the visit note. 

## 2015-08-04 NOTE — Progress Notes (Signed)
Office Note 08/04/2015  CC:  Chief Complaint  Patient presents with  . Annual Exam    Pt is fasting.     HPI:  Nancy Adams is a 45 y.o. White female who is here for annual health maintenance exam. GYN: pap smear done 01/2015.  Mammogram done via her GYN w/in the last 1 yr. Her colonoscopy is UTD--it was normal.  Since getting her menses/hormones figured out, her diarrhea has resolved.  Rash on back coming and going for the last 2-3 mo, itches.  No other areas of body with same rash. She has sensitive skin per her report.  She puts otc hydrocortisone on it and it doesn't seem to help.  Left CVA area with a "lump" for about 6 mo, getting smaller.  It does not hurt.  No overlying skin changes. No f/c/malaise or night sweats.    Past Medical History  Diagnosis Date  . Gestational diabetes   . Allergic rhinitis   . Asthma     mild intermittent  . History of urinary tract infection   . GERD (gastroesophageal reflux disease)   . Anxiety and depression   . Irritable bowel syndrome (IBS)     mixed type  . Arthritis     great toes, thumbs  . Vitamin D deficiency     dx'd at [facility] 2014 per pt--she did not take any replacement vit D  . Obesity, Class I, BMI 30-34.9   . Hyperlipemia, mixed     Mild, improved w/out meds from 2015 to 2016  . Chronic insomnia     with RLS component + anxiety component contributing.  . DUB (dysfunctional uterine bleeding) 2016    GYN: likely anovulatory, unremarkable pelvic u/s 09/2014.  Pt started on cyclic progesterone (734KA qd x 12d every 60d prn if no spontaneous cycle-Dr. Rivard)  . Menopausal flushing   . Female fertility problem   . Pancreatic insufficiency (HCC)     Dr. Hilarie Fredrickson    Past Surgical History  Procedure Laterality Date  . Cesarean section    . Colonoscopy  2005; 11/24/14    Normal.  2016: recall 7 yrs.  Colon biopsies normal.  . Esophagogastroduodenoscopy  2004    normal per pt    Family History  Problem Relation Age  of Onset  . Arthritis Mother   . Allergic rhinitis Mother   . Heart disease Father 71  . Hypertension Father   . Asthma Sister   . Allergic rhinitis Sister   . Asthma Brother   . Allergic rhinitis Brother   . Heart disease Paternal Grandmother   . Cancer Paternal Grandmother     leukemia, lived to age 70  . Diabetes Neg Hx   . Stroke Neg Hx   . Colon cancer Neg Hx   . Colon polyps Neg Hx   . Crohn's disease Neg Hx     Social History   Social History  . Marital Status: Married    Spouse Name: N/A  . Number of Children: N/A  . Years of Education: N/A   Occupational History  . Not on file.   Social History Main Topics  . Smoking status: Never Smoker   . Smokeless tobacco: Never Used  . Alcohol Use: 0.0 oz/week     Comment: socially  . Drug Use: No  . Sexual Activity: Not on file   Other Topics Concern  . Not on file   Social History Narrative   Married, has 2 twin boys, elementary age.  Homemaker.   Relocated from [place].   Exercises with biking, ellipitical, 3-4 days per week, [employer].   No tobacco.  No drugs.  Social alcohol.   Takes Ca+vit D qd and a MVI qd. Of note: she stopped the pancreatic enzymes approx 02/2015 and has felt no GI repercussions.  Outpatient Prescriptions Prior to Visit  Medication Sig Dispense Refill  . albuterol (PROVENTIL HFA;VENTOLIN HFA) 108 (90 BASE) MCG/ACT inhaler Inhale 2 puffs into the lungs every 6 (six) hours as needed for wheezing.    . calcium-vitamin D (OSCAL WITH D) 500-200 MG-UNIT per tablet Take 1 tablet by mouth.    . clonazePAM (KLONOPIN) 0.5 MG tablet 1-2 tabs po qhs for restless legs syndrome and insomnia 60 tablet 5  . Multiple Vitamin (MULTIVITAMIN) tablet Take 1 tablet by mouth daily.    . Probiotic Product (PROBIOTIC DAILY) CAPS Take 1 capsule by mouth daily.    . sertraline (ZOLOFT) 50 MG tablet Take 1 tablet (50 mg total) by mouth daily. 30 tablet 11  . Pancrelipase, Lip-Prot-Amyl, 24000 UNITS  CPEP Take 1 capsule (24,000 Units total) by mouth as directed. (Patient not taking: Reported on 08/04/2015) 100 capsule 0  . Pancrelipase, Lip-Prot-Amyl, 24000 UNITS CPEP Take three with large fat containing meals, take 2 with medium sized meals, and 1 with snacks daily (Patient not taking: Reported on 08/04/2015) 180 capsule 2   No facility-administered medications prior to visit.    Allergies  Allergen Reactions  . Penicillins     Reaction unknown    ROS Review of Systems  Constitutional: Negative for fever, chills, appetite change and fatigue.  HENT: Negative for congestion, dental problem, ear pain and sore throat.   Eyes: Negative for discharge, redness and visual disturbance.  Respiratory: Negative for cough, chest tightness, shortness of breath and wheezing.   Cardiovascular: Negative for chest pain, palpitations and leg swelling.  Gastrointestinal: Negative for nausea, vomiting, abdominal pain, diarrhea and blood in stool.  Genitourinary: Negative for dysuria, urgency, frequency, hematuria, flank pain and difficulty urinating.  Musculoskeletal: Negative for myalgias, back pain, joint swelling, arthralgias and neck stiffness.  Skin: Negative for pallor.  Neurological: Negative for dizziness, speech difficulty, weakness and headaches.  Hematological: Negative for adenopathy. Does not bruise/bleed easily.  Psychiatric/Behavioral: Negative for confusion and sleep disturbance. The patient is not nervous/anxious.     PE; Blood pressure 108/75, pulse 67, temperature 97.1 F (36.2 C), temperature source Oral, resp. rate 16, height 5' 5.5" (1.664 m), weight 197 lb 4 oz (89.472 kg), last menstrual period 06/08/2015, SpO2 98 %. Gen: Alert, well appearing.  Patient is oriented to person, place, time, and situation. AFFECT: pleasant, lucid thought and speech. ENT: Ears: EACs clear, normal epithelium.  TMs with good light reflex and landmarks bilaterally.  Eyes: no injection, icteris,  swelling, or exudate.  EOMI, PERRLA. Nose: no drainage or turbinate edema/swelling.  No injection or focal lesion.  Mouth: lips without lesion/swelling.  Oral mucosa pink and moist.  Dentition intact and without obvious caries or gingival swelling.  Oropharynx without erythema, exudate, or swelling.  Neck: supple/nontender.  No LAD, mass, or TM.  Carotid pulses 2+ bilaterally, without bruits. CV: RRR, no m/r/g.   LUNGS: CTA bilat, nonlabored resps, good aeration in all lung fields. ABD: soft, NT, ND, BS normal.  No hepatospenomegaly or mass.  No bruits. EXT: no clubbing, cyanosis, or edema.  Musculoskeletal: no joint swelling, erythema, warmth, or tenderness.  ROM of all joints intact. Skin - no sores  or suspicious lesions or color changes. In mid aspect of low back she has a 5-6 cm irregular shaped region of lightly erythematous and mildly hyperkeratotic skin.   Left CVA region: there is a nontender, jelly-bean sized subQ nodule that is firm, slightly moveable and not attached to underlying rib.  No overlying skin changes.    Pertinent labs:   Lab Results  Component Value Date   TSH 2.60 08/03/2014   Lab Results  Component Value Date   WBC 9.2 08/03/2014   HGB 13.5 08/03/2014   HCT 40.0 08/03/2014   MCV 93.1 08/03/2014   PLT 319.0 08/03/2014   Lab Results  Component Value Date   CREATININE 0.68 08/03/2014   BUN 5* 08/03/2014   NA 135 08/03/2014   K 4.2 08/03/2014   CL 99 08/03/2014   CO2 30 08/03/2014   Lab Results  Component Value Date   ALT 29 08/03/2014   AST 20 08/03/2014   ALKPHOS 90 08/03/2014   BILITOT 0.6 08/03/2014   Lab Results  Component Value Date   CHOL 228* 08/03/2014   Lab Results  Component Value Date   HDL 43.40 08/03/2014   Lab Results  Component Value Date   LDLCALC 146* 08/03/2014   Lab Results  Component Value Date   TRIG 192.0* 08/03/2014   Lab Results  Component Value Date   CHOLHDL 5 08/03/2014    ASSESSMENT AND PLAN:   1) 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 Other possibilities are neuroma.  Less likely to be lipoma given its firm consistency.  Watchful waiting approach recommended since it is already shrinking per pt report.  2) Nonspecific dermatitis, localized to her low back.  Moisturize the area more.  Cutivate 0.05% cream rx'd to use bid prn.  3) Health maintenance exam: Reviewed age and gender appropriate health maintenance issues (prudent diet, regular exercise, health risks of tobacco and excessive alcohol, use of seatbelts, fire alarms in home, use of sunscreen).  Also reviewed age and gender appropriate health screening as well as vaccine recommendations. No vaccines due.   Fasting HP labs + vit D level (for hx of vit D deficiency) drawn today. She is UTD on GYN care.  An After Visit Summary was printed and given to the patient.  FOLLOW UP:  Return in about 1 year (around 08/03/2016) for annual CPE (fasting).

## 2015-08-05 ENCOUNTER — Other Ambulatory Visit (INDEPENDENT_AMBULATORY_CARE_PROVIDER_SITE_OTHER): Payer: BLUE CROSS/BLUE SHIELD

## 2015-08-05 ENCOUNTER — Other Ambulatory Visit: Payer: Self-pay | Admitting: *Deleted

## 2015-08-05 DIAGNOSIS — R7301 Impaired fasting glucose: Secondary | ICD-10-CM

## 2015-08-05 LAB — HEMOGLOBIN A1C: Hgb A1c MFr Bld: 5.7 % (ref 4.6–6.5)

## 2015-08-05 MED ORDER — TRIAMCINOLONE ACETONIDE 0.025 % EX CREA: 1.0000 "application " | TOPICAL_CREAM | Freq: Two times a day (BID) | CUTANEOUS | Status: DC | PRN

## 2015-08-06 ENCOUNTER — Encounter: Payer: Self-pay | Admitting: Family Medicine

## 2015-08-06 ENCOUNTER — Telehealth: Payer: Self-pay

## 2015-08-06 DIAGNOSIS — E559 Vitamin D deficiency, unspecified: Secondary | ICD-10-CM

## 2015-08-06 MED ORDER — VITAMIN D (ERGOCALCIFEROL) 1.25 MG (50000 UNIT) PO CAPS: 50000.0000 [IU] | ORAL_CAPSULE | ORAL | Status: DC

## 2015-08-06 NOTE — Telephone Encounter (Signed)
Spoke to patient. Gave results and instructions. Patient verbalized understanding.

## 2015-08-06 NOTE — Telephone Encounter (Signed)
-----   Message from Tammi Sou, MD sent at 08/06/2015  4:51 PM EDT ----- Vit D low: pls send in ergocalciferol 50,000 U tabs, 1 tab q week x 12 weeks, #4, RF x 2.  Lab visit for recheck 25-OH vit D level, dx vit D deficiency. Also, her fasting glucose was a tiny bit high.  However, her HbA1c, which shows avg glucose over the prev 3-4 months, was normal (5.7%).   Finally, her triglycerides or "fats' in the blood were a little high.  No meds recommended.  Push the low fat/low carb diet + cardiovascular exercise.-thx

## 2015-08-19 ENCOUNTER — Other Ambulatory Visit: Payer: Self-pay | Admitting: *Deleted

## 2015-08-19 MED ORDER — SERTRALINE HCL 50 MG PO TABS: 50.0000 mg | ORAL_TABLET | Freq: Every day | ORAL | Status: DC

## 2015-08-19 NOTE — Telephone Encounter (Signed)
RF request for sertraline LOV: 11/20/14  Next ov: None Last written: 08/03/14 #30 w/ 11RF

## 2015-09-03 ENCOUNTER — Other Ambulatory Visit: Payer: Self-pay | Admitting: *Deleted

## 2015-09-03 MED ORDER — CLONAZEPAM 0.5 MG PO TABS: ORAL_TABLET | ORAL | Status: DC

## 2015-09-03 NOTE — Telephone Encounter (Signed)
[facility] in [place] Fax: [phone]  RF request for clonazepam LOV: 08/04/15 Next ov: None Last written: 01/28/15 #60 w/ 5RF  Please advise. Thanks.

## 2015-09-03 NOTE — Telephone Encounter (Signed)
Rx faxed

## 2015-09-09 ENCOUNTER — Encounter: Payer: Self-pay | Admitting: Family Medicine

## 2016-02-10 LAB — HM MAMMOGRAPHY

## 2016-04-04 ENCOUNTER — Other Ambulatory Visit: Payer: Self-pay | Admitting: *Deleted

## 2016-04-04 MED ORDER — CLONAZEPAM 0.5 MG PO TABS: ORAL_TABLET | ORAL | 5 refills | Status: DC

## 2016-04-04 NOTE — Telephone Encounter (Signed)
Rx faxed

## 2016-04-04 NOTE — Telephone Encounter (Signed)
RF request for clonazepam LOV: 08/04/15 Next ov: None Last written: 09/03/15 #60 w/ 5RF  Please advise. Thanks.

## 2016-06-07 ENCOUNTER — Encounter: Payer: Self-pay | Admitting: Family Medicine

## 2016-06-07 ENCOUNTER — Ambulatory Visit (INDEPENDENT_AMBULATORY_CARE_PROVIDER_SITE_OTHER): Payer: BLUE CROSS/BLUE SHIELD | Admitting: Family Medicine

## 2016-06-07 VITALS — BP 115/76 | HR 76 | Temp 98.5°F | Resp 20 | Wt 199.0 lb

## 2016-06-07 DIAGNOSIS — J029 Acute pharyngitis, unspecified: Secondary | ICD-10-CM | POA: Diagnosis not present

## 2016-06-07 LAB — POCT RAPID STREP A (OFFICE): Rapid Strep A Screen: NEGATIVE

## 2016-06-07 MED ORDER — CEPHALEXIN 500 MG PO CAPS: 500.0000 mg | ORAL_CAPSULE | Freq: Two times a day (BID) | ORAL | 0 refills | Status: DC

## 2016-06-07 NOTE — Progress Notes (Signed)
Nancy Adams , [birth year], 46 y.o., female MRN: [id number] Patient Care Team    Relationship Specialty Notifications Start End  Tammi Sou, MD PCP - General Family Medicine  07/18/13   Delsa Bern, MD Consulting Physician Obstetrics and Gynecology  09/10/14   Jerene Bears, MD Consulting Physician Gastroenterology  11/20/14     CC: sore throat Subjective: Pt presents for an acute OV with complaints of sore throat of 2 days duration.  Associated symptoms include sore throat, fatigue, runny nose.  Her husband has strep throat and she has noticed "white spots" in her throat.  Pt has tried tylenol to ease their symptoms. Pt states she has had mono in the past and when given PCN with it she developed a rash. She doe snot think she is truly allergic to PCN.   Allergies  Allergen Reactions  . Penicillins     Reaction unknown   Social History  Substance Use Topics  . Smoking status: Never Smoker  . Smokeless tobacco: Never Used  . Alcohol use 0.0 oz/week     Comment: socially   Past Medical History:  Diagnosis Date  . Allergic rhinitis   . Anxiety and depression   . Arthritis    great toes, thumbs  . Asthma    mild intermittent  . Chronic insomnia    with RLS component + anxiety component contributing.  . DUB (dysfunctional uterine bleeding) 2016   GYN: likely anovulatory, unremarkable pelvic u/s 09/2014.  Pt started on cyclic progesterone (601UX qd x 12d every 60d prn if no spontaneous cycle-Dr. Rivard)  . Female fertility problem   . GERD (gastroesophageal reflux disease)   . Gestational diabetes   . History of urinary tract infection   . Hyperlipemia, mixed    Mild, improved w/out meds from 2015 to 2016.  2017 10 yr framingham risk was <1%.  . IFG (impaired fasting glucose) 07/2015   A1c 5.7%  . Irritable bowel syndrome (IBS)    mixed type  . Menopausal flushing   . Obesity, Class I, BMI 30-34.9   . Pancreatic insufficiency    Dr. Hilarie Fredrickson  . Vitamin D deficiency    dx'd at [facility] 2014 per pt--she did not take any replacement vit D   Past Surgical History:  Procedure Laterality Date  . CESAREAN SECTION    . COLONOSCOPY  2005; 11/24/14   Normal.  2016: recall 7 yrs.  Colon biopsies normal.  . ESOPHAGOGASTRODUODENOSCOPY  2004   normal per pt   Family History  Problem Relation Age of Onset  . Arthritis Mother   . Allergic rhinitis Mother   . Heart disease Father 47  . Hypertension Father   . Asthma Sister   . Allergic rhinitis Sister   . Asthma Brother   . Allergic rhinitis Brother   . Heart disease Paternal Grandmother   . Cancer Paternal Grandmother     leukemia, lived to age 30  . Diabetes Neg Hx   . Stroke Neg Hx   . Colon cancer Neg Hx   . Colon polyps Neg Hx   . Crohn's disease Neg Hx    Allergies as of 06/07/2016      Reactions   Penicillins    Reaction unknown      Medication List       Accurate as of 06/07/16  9:58 AM. Always use your most recent med list.          albuterol 108 (90 Base) MCG/ACT inhaler  Commonly known as:  PROVENTIL HFA;VENTOLIN HFA Inhale 2 puffs into the lungs every 6 (six) hours as needed for wheezing.   calcium-vitamin D 500-200 MG-UNIT tablet Commonly known as:  OSCAL WITH D Take 1 tablet by mouth.   clonazePAM 0.5 MG tablet Commonly known as:  KLONOPIN 1-2 tabs po qhs for restless legs syndrome and insomnia   multivitamin tablet Take 1 tablet by mouth daily.   PROBIOTIC DAILY Caps Take 1 capsule by mouth daily.   sertraline 50 MG tablet Commonly known as:  ZOLOFT Take 1 tablet (50 mg total) by mouth daily.       No results found for this or any previous visit (from the past 24 hour(s)). No results found.   ROS: Negative, with the exception of above mentioned in HPI   Objective:  BP 115/76 (BP Location: Left Arm, Patient Position: Sitting, Cuff Size: Large)   Pulse 76   Temp 98.5 F (36.9 C)   Resp 20   Wt 199 lb (90.3 kg)   SpO2 98%   BMI 32.61 kg/m  Body mass index is  32.61 kg/m. Gen: Afebrile. No acute distress. Nontoxic in appearance, well developed, well nourished.  HENT: AT. [place]. Bilateral TM visualized bilateral air fluid levels. MMM, no oral lesions. Bilateral nares with erythema. Throat with erythema and exudates. Mild cough, hoarseness present. No TTp sinus cavity.  Eyes:Pupils Equal Round Reactive to light, Extraocular movements intact,  Conjunctiva without redness, discharge or icterus. Neck/lymp/endocrine: Supple,no lymphadenopathy CV: RRR  Chest: CTAB, no wheeze or crackles. Good air movement, normal resp effort.  Abd: Soft. NTND. BS present. no Masses palpated.  Skin: no rashes, purpura or petechiae.  Neuro: Normal gait. PERLA. EOMi. Alert. Oriented x3   Results for orders placed or performed in visit on 06/07/16 (from the past 24 hour(s))  POCT rapid strep A     Status: Normal   Collection Time: 06/07/16 10:12 AM  Result Value Ref Range   Rapid Strep A Screen Negative Negative    Assessment/Plan: Nancy Adams is a 46 y.o. female present for acute OV for  Sore throat/pharyngitis - POCT rapid strep A: negative - pt family also with strep infection. Exudates present today, will treat as strep with keflex BID x 10d (PCN poss allergy- rash- but it was probably from PCN use with mono) - rest, hydrate. Mucinex. OTC therapy.  - F/U PRN   electronically signed by:  Howard Pouch, DO  [place]

## 2016-06-07 NOTE — Patient Instructions (Signed)
kelfex every 12 hours 10 days.  Rest, hydrate.  Tylenol/advil for fever/pain.    I believe you do have strep throat.    Pharyngitis Pharyngitis is a sore throat (pharynx). There is redness, pain, and swelling of your throat. Follow these instructions at home:  Drink enough fluids to keep your pee (urine) clear or pale yellow.  Only take medicine as told by your doctor.  You may get sick again if you do not take medicine as told. Finish your medicines, even if you start to feel better.  Do not take aspirin.  Rest.  Rinse your mouth (gargle) with salt water ( tsp of salt per 1 qt of water) every 1-2 hours. This will help the pain.  If you are not at risk for choking, you can suck on hard candy or sore throat lozenges. Contact a doctor if:  You have large, tender lumps on your neck.  You have a rash.  You cough up green, yellow-brown, or bloody spit. Get help right away if:  You have a stiff neck.  You drool or cannot swallow liquids.  You throw up (vomit) or are not able to keep medicine or liquids down.  You have very bad pain that does not go away with medicine.  You have problems breathing (not from a stuffy nose). This information is not intended to replace advice given to you by your health care provider. Make sure you discuss any questions you have with your health care provider. Document Released: 10/11/2007 Document Revised: 09/30/2015 Document Reviewed: 12/30/2012 Elsevier Interactive Patient Education  2017 [employer].

## 2016-08-03 ENCOUNTER — Encounter: Payer: Self-pay | Admitting: Family Medicine

## 2016-08-03 ENCOUNTER — Ambulatory Visit (INDEPENDENT_AMBULATORY_CARE_PROVIDER_SITE_OTHER): Payer: BLUE CROSS/BLUE SHIELD | Admitting: Family Medicine

## 2016-08-03 VITALS — BP 107/75 | HR 84 | Temp 98.0°F | Resp 16 | Ht 65.75 in | Wt 198.2 lb

## 2016-08-03 DIAGNOSIS — B070 Plantar wart: Secondary | ICD-10-CM

## 2016-08-03 DIAGNOSIS — Z0000 Encounter for general adult medical examination without abnormal findings: Secondary | ICD-10-CM | POA: Diagnosis not present

## 2016-08-03 DIAGNOSIS — R7301 Impaired fasting glucose: Secondary | ICD-10-CM | POA: Diagnosis not present

## 2016-08-03 DIAGNOSIS — R140 Abdominal distension (gaseous): Secondary | ICD-10-CM | POA: Diagnosis not present

## 2016-08-03 LAB — CBC WITH DIFFERENTIAL/PLATELET
BASOS PCT: 1.6 % (ref 0.0–3.0)
Basophils Absolute: 0.2 10*3/uL — ABNORMAL HIGH (ref 0.0–0.1)
EOS ABS: 0.2 10*3/uL (ref 0.0–0.7)
EOS PCT: 2.5 % (ref 0.0–5.0)
HEMATOCRIT: 43 % (ref 36.0–46.0)
Hemoglobin: 14.5 g/dL (ref 12.0–15.0)
LYMPHS PCT: 22.6 % (ref 12.0–46.0)
Lymphs Abs: 2.2 10*3/uL (ref 0.7–4.0)
MCHC: 33.8 g/dL (ref 30.0–36.0)
MCV: 93.6 fl (ref 78.0–100.0)
MONO ABS: 0.5 10*3/uL (ref 0.1–1.0)
Monocytes Relative: 5.5 % (ref 3.0–12.0)
NEUTROS ABS: 6.7 10*3/uL (ref 1.4–7.7)
Neutrophils Relative %: 67.8 % (ref 43.0–77.0)
PLATELETS: 304 10*3/uL (ref 150.0–400.0)
RBC: 4.59 Mil/uL (ref 3.87–5.11)
RDW: 12.3 % (ref 11.5–15.5)
WBC: 9.9 10*3/uL (ref 4.0–10.5)

## 2016-08-03 LAB — LIPID PANEL
CHOLESTEROL: 264 mg/dL — AB (ref 0–200)
HDL: 50 mg/dL (ref >39.00)
LDL Cholesterol: 179 mg/dL — ABNORMAL HIGH (ref 0–99)
NonHDL: 214.43
Total CHOL/HDL Ratio: 5
Triglycerides: 177 mg/dL — ABNORMAL HIGH (ref 0.0–149.0)
VLDL: 35.4 mg/dL (ref 0.0–40.0)

## 2016-08-03 LAB — COMPREHENSIVE METABOLIC PANEL
ALBUMIN: 4.5 g/dL (ref 3.5–5.2)
ALT: 37 U/L — ABNORMAL HIGH (ref 0–35)
AST: 25 U/L (ref 0–37)
Alkaline Phosphatase: 91 U/L (ref 39–117)
BILIRUBIN TOTAL: 0.5 mg/dL (ref 0.2–1.2)
BUN: 9 mg/dL (ref 6–23)
CALCIUM: 9.4 mg/dL (ref 8.4–10.5)
CO2: 30 mEq/L (ref 19–32)
Chloride: 99 mEq/L (ref 96–112)
Creatinine, Ser: 0.72 mg/dL (ref 0.40–1.20)
GFR: 93.25 mL/min (ref >60.00)
Glucose, Bld: 110 mg/dL — ABNORMAL HIGH (ref 70–99)
POTASSIUM: 4.5 meq/L (ref 3.5–5.1)
Sodium: 137 mEq/L (ref 135–145)
TOTAL PROTEIN: 7.4 g/dL (ref 6.0–8.3)

## 2016-08-03 LAB — HEMOGLOBIN A1C: HEMOGLOBIN A1C: 5.9 % (ref 4.6–6.5)

## 2016-08-03 LAB — TSH: TSH: 3.3 u[IU]/mL (ref 0.35–4.50)

## 2016-08-03 LAB — LIPASE: LIPASE: 39 U/L (ref 11.0–59.0)

## 2016-08-03 NOTE — Progress Notes (Signed)
Pre visit review using our clinic review tool, if applicable. No additional management support is needed unless otherwise documented below in the visit note. 

## 2016-08-03 NOTE — Progress Notes (Signed)
Office Note 08/03/2016  CC:  Chief Complaint  Patient presents with  . Annual Exam    Pt is fasting.     HPI:  Nancy Adams is a 46 y.o. female who is here for annual health maintenance exam.  Last mammo in EMR is 02/2015 (normal).  She did have one in 2017 at Dr. Boyd Kerbs office.  It was normal per pt. Last pap/pelvic: on schedule with Dr. Cletis Media.  Eye exam in last 2 yrs: yes. Dental preventative visits UTD.  Past Medical History:  Diagnosis Date  . Allergic rhinitis   . Anxiety and depression   . Arthritis    great toes, thumbs  . Asthma    mild intermittent  . Chronic insomnia    with RLS component + anxiety component contributing.  . DUB (dysfunctional uterine bleeding) 2016   GYN: likely anovulatory, unremarkable pelvic u/s 09/2014.  Pt started on cyclic progesterone (220UR qd x 12d every 60d prn if no spontaneous cycle-Dr. Rivard)  . Female fertility problem   . GERD (gastroesophageal reflux disease)   . Gestational diabetes   . History of urinary tract infection   . Hyperlipemia, mixed    Mild, improved w/out meds from 2015 to 2016.  2017 10 yr framingham risk was <1%.  . IFG (impaired fasting glucose) 07/2015   A1c 5.7%  . Irritable bowel syndrome (IBS)    mixed type  . Menopausal flushing   . Obesity, Class I, BMI 30-34.9   . Pancreatic insufficiency    Dr. Hilarie Fredrickson  . Vitamin D deficiency    dx'd at [facility] 2014 per pt--she did not take any replacement vit D    Past Surgical History:  Procedure Laterality Date  . CESAREAN SECTION    . COLONOSCOPY  2005; 11/24/14   Normal.  2016: recall 7 yrs.  Colon biopsies normal.  . ESOPHAGOGASTRODUODENOSCOPY  2004   normal per pt    Family History  Problem Relation Age of Onset  . Arthritis Mother   . Allergic rhinitis Mother   . Heart disease Father 75  . Hypertension Father   . Asthma Sister   . Allergic rhinitis Sister   . Asthma Brother   . Allergic rhinitis Brother   . Heart disease Paternal  Grandmother   . Cancer Paternal Grandmother     leukemia, lived to age 56  . Diabetes Neg Hx   . Stroke Neg Hx   . Colon cancer Neg Hx   . Colon polyps Neg Hx   . Crohn's disease Neg Hx     Social History   Social History  . Marital status: Married    Spouse name: N/A  . Number of children: N/A  . Years of education: N/A   Occupational History  . Not on file.   Social History Main Topics  . Smoking status: Never Smoker  . Smokeless tobacco: Never Used  . Alcohol use 0.0 oz/week     Comment: socially  . Drug use: No  . Sexual activity: Not on file   Other Topics Concern  . Not on file   Social History Narrative   Married, has 2 twin boys, elementary age.   Homemaker.   Relocated from [place].   Exercises with biking, ellipitical, 3-4 days per week, [employer].   No tobacco.  No drugs.  Social alcohol.    Outpatient Medications Prior to Visit  Medication Sig Dispense Refill  . albuterol (PROVENTIL HFA;VENTOLIN HFA) 108 (90 BASE) MCG/ACT inhaler  Inhale 2 puffs into the lungs every 6 (six) hours as needed for wheezing.    . calcium-vitamin D (OSCAL WITH D) 500-200 MG-UNIT per tablet Take 1 tablet by mouth.    . clonazePAM (KLONOPIN) 0.5 MG tablet 1-2 tabs po qhs for restless legs syndrome and insomnia 60 tablet 5  . Multiple Vitamin (MULTIVITAMIN) tablet Take 1 tablet by mouth daily.    . sertraline (ZOLOFT) 50 MG tablet Take 1 tablet (50 mg total) by mouth daily. 30 tablet 11  . cephALEXin (KEFLEX) 500 MG capsule Take 1 capsule (500 mg total) by mouth 2 (two) times daily. (Patient not taking: Reported on 08/03/2016) 20 capsule 0  . Probiotic Product (PROBIOTIC DAILY) CAPS Take 1 capsule by mouth daily.     No facility-administered medications prior to visit.     Allergies  Allergen Reactions  . Penicillins     Reaction unknown    ROS Review of Systems  Constitutional: Negative for appetite change, chills, fatigue and fever.  HENT: Negative for  congestion, dental problem, ear pain and sore throat.   Eyes: Negative for discharge, redness and visual disturbance.  Respiratory: Negative for cough, chest tightness, shortness of breath and wheezing.   Cardiovascular: Negative for chest pain, palpitations and leg swelling.  Gastrointestinal: Negative for abdominal pain, blood in stool, diarrhea, nausea and vomiting.       LUQ bloating feeling if she eats a lot or if > 1 beer/wine the night prior.  Occurs about 1 time per week.  Genitourinary: Negative for difficulty urinating, dysuria, flank pain, frequency, hematuria and urgency.  Musculoskeletal: Negative for arthralgias, back pain, joint swelling, myalgias and neck stiffness.  Skin: Negative for pallor and rash.       Wants "spot" on back checked again. Also, right foot plantar wart that causes pain.  Neurological: Negative for dizziness, speech difficulty, weakness and headaches.  Hematological: Negative for adenopathy. Does not bruise/bleed easily.  Psychiatric/Behavioral: Negative for confusion and sleep disturbance. The patient is not nervous/anxious.     PE; Blood pressure 107/75, pulse 84, temperature 98 F (36.7 C), temperature source Temporal, resp. rate 16, height 5' 5.75" (1.67 m), weight 198 lb 4 oz (89.9 kg), SpO2 96 %. Gen: Alert, well appearing.  Patient is oriented to person, place, time, and situation. AFFECT: pleasant, lucid thought and speech. ENT: Ears: EACs clear, normal epithelium.  TMs with good light reflex and landmarks bilaterally.  Eyes: no injection, icteris, swelling, or exudate.  EOMI, PERRLA. Nose: no drainage or turbinate edema/swelling.  No injection or focal lesion.  Mouth: lips without lesion/swelling.  Oral mucosa pink and moist.  Dentition intact and without obvious caries or gingival swelling.  Oropharynx without erythema, exudate, or swelling.  Neck: supple/nontender.  No LAD, mass, or TM.  CV: RRR, no m/r/g.   LUNGS: CTA bilat, nonlabored resps,  good aeration in all lung fields. ABD: soft, NT, ND, BS normal.  No hepatospenomegaly or mass.  No bruits. EXT: no clubbing, cyanosis, or edema.  Musculoskeletal: no joint swelling, erythema, warmth, or tenderness.  ROM of all joints intact. Skin - no sores or suspicious lesions or rashes or color changes.  Left side/mid back level: approx 2 cm oblong subQ nodular lesion that feels mildly indurated and is moveable.  No tenderness.  No overlying skin changes. Left foot plantar surface with 3 mm diameter plantar wart over head of 3rd metatarsal. Cryotherapy applied to this today.  Pertinent labs:  Lab Results  Component Value Date  TSH 1.35 08/04/2015   Lab Results  Component Value Date   WBC 6.4 08/04/2015   HGB 13.8 08/04/2015   HCT 40.6 08/04/2015   MCV 92.5 08/04/2015   PLT 317.0 08/04/2015   Lab Results  Component Value Date   CREATININE 0.65 08/04/2015   BUN 9 08/04/2015   NA 137 08/04/2015   K 4.2 08/04/2015   CL 100 08/04/2015   CO2 29 08/04/2015   Lab Results  Component Value Date   ALT 22 08/04/2015   AST 18 08/04/2015   ALKPHOS 80 08/04/2015   BILITOT 0.6 08/04/2015   Lab Results  Component Value Date   CHOL 237 (H) 08/04/2015   Lab Results  Component Value Date   HDL 49.40 08/04/2015   Lab Results  Component Value Date   LDLCALC 146 (H) 08/03/2014   Lab Results  Component Value Date   TRIG 212.0 (H) 08/04/2015   Lab Results  Component Value Date   CHOLHDL 5 08/04/2015   Lab Results  Component Value Date   HGBA1C 5.7 08/05/2015    ASSESSMENT AND PLAN:   1) Abdominal bloating, LUQ.  Reassured pt that this is likely a manifestation of her IBS. Doubtful that this is pancreatic in etiology but will check lipase today.  2) Hx of IFG: check Hba1c today as well as fasting glucose.  3) Plantar wart of left foot: cryotherapy applied today for 40 seconds x 2 freeze/thaw cycles. Pt tolerated procedure well.  No immediate complications.  4) Health  maintenance exam: Reviewed age and gender appropriate health maintenance issues (prudent diet, regular exercise, health risks of tobacco and excessive alcohol, use of seatbelts, fire alarms in home, use of sunscreen).  Also reviewed age and gender appropriate health screening as well as vaccine recommendations. Fasting HP labs today. Vaccines UTD. Cerv ca screening: UTD--pt to f/u with GYN soon. Breast ca screening: UTD--pt to f/u with GYN soon.  An After Visit Summary was printed and given to the patient.  FOLLOW UP:  Return in about 1 year (around 08/03/2017) for annual CPE (fasting).  Signed:  Crissie Sickles, MD           08/03/2016

## 2016-08-03 NOTE — Patient Instructions (Signed)
Health Maintenance, Female Adopting a healthy lifestyle and getting preventive care can go a long way to promote health and wellness. Talk with your health care provider about what schedule of regular examinations is right for you. This is a good chance for you to check in with your provider about disease prevention and staying healthy. In between checkups, there are plenty of things you can do on your own. Experts have done a lot of research about which lifestyle changes and preventive measures are most likely to keep you healthy. Ask your health care provider for more information. Weight and diet Eat a healthy diet  Be sure to include plenty of vegetables, fruits, low-fat dairy products, and lean protein.  Do not eat a lot of foods high in solid fats, added sugars, or salt.  Get regular exercise. This is one of the most important things you can do for your health.  Most adults should exercise for at least 150 minutes each week. The exercise should increase your heart rate and make you sweat (moderate-intensity exercise).  Most adults should also do strengthening exercises at least twice a week. This is in addition to the moderate-intensity exercise. Maintain a healthy weight  Body mass index (BMI) is a measurement that can be used to identify possible weight problems. It estimates body fat based on height and weight. Your health care provider can help determine your BMI and help you achieve or maintain a healthy weight.  For females 45 years of age and older:  A BMI below 18.5 is considered underweight.  A BMI of 18.5 to 24.9 is normal.  A BMI of 25 to 29.9 is considered overweight.  A BMI of 30 and above is considered obese. Watch levels of cholesterol and blood lipids  You should start having your blood tested for lipids and cholesterol at 45 years of age, then have this test every 5 years.  You may need to have your cholesterol levels checked more often if:  Your lipid or  cholesterol levels are high.  You are older than 45 years of age.  You are at high risk for heart disease. Cancer screening Lung Cancer  Lung cancer screening is recommended for adults 45-42 years old who are at high risk for lung cancer because of a history of smoking.  A yearly low-dose CT scan of the lungs is recommended for people who:  Currently smoke.  Have quit within the past 45 years.  Have at least a 30-pack-year history of smoking. A pack year is smoking an average of one pack of cigarettes a day for 1 year.  Yearly screening should continue until it has been 15 years since you quit.  Yearly screening should stop if you develop a health problem that would prevent you from having lung cancer treatment. Breast Cancer  Practice breast self-awareness. This means understanding how your breasts normally appear and feel.  It also means doing regular breast self-exams. Let your health care provider know about any changes, no matter how small.  If you are in your 45s or 30s, you should have a clinical breast exam (CBE) by a health care provider every 1-3 years as part of a regular health exam.  If you are 45 or older, have a CBE every year. Also consider having a breast X-ray (mammogram) every year.  If you are 45 or older, have a CBE every year.  If you have a family history of breast cancer, talk to your health care provider about genetic screening.  If you are at high risk for breast cancer, talk  to your health care provider about having an MRI and a mammogram every year.  Breast cancer gene (BRCA) assessment is recommended for women who have family members with BRCA-related cancers. BRCA-related cancers include:  Breast.  Ovarian.  Tubal.  Peritoneal cancers.  Results of the assessment will determine the need for genetic counseling and BRCA1 and BRCA2 testing. Cervical Cancer  Your health care provider may recommend that you be screened regularly for cancer of the pelvic organs (ovaries, uterus, and vagina).  This screening involves a pelvic examination, including checking for microscopic changes to the surface of your cervix (Pap test). You may be encouraged to have this screening done every 3 years, beginning at age 45.  For women ages 45-65, health care providers may recommend pelvic exams and Pap testing every 3 years, or they may recommend the Pap and pelvic exam, combined with testing for human papilloma virus (HPV), every 5 years. Some types of HPV increase your risk of cervical cancer. Testing for HPV may also be done on women of any age with unclear Pap test results.  Other health care providers may not recommend any screening for nonpregnant women who are considered low risk for pelvic cancer and who do not have symptoms. Ask your health care provider if a screening pelvic exam is right for you.  If you have had past treatment for cervical cancer or a condition that could lead to cancer, you need Pap tests and screening for cancer for at least 20 years after your treatment. If Pap tests have been discontinued, your risk factors (such as having a new sexual partner) need to be reassessed to determine if screening should resume. Some women have medical problems that increase the chance of getting cervical cancer. In these cases, your health care provider may recommend more frequent screening and Pap tests. Colorectal Cancer  This type of cancer can be detected and often prevented.  Routine colorectal cancer screening usually begins at 45 years of age and continues through 45 years of age.  Your health care provider may recommend screening at an earlier age if you have risk factors for colon cancer.  Your health care provider may also recommend using home test kits to check for hidden blood in the stool.  A small camera at the end of a tube can be used to examine your colon directly (sigmoidoscopy or colonoscopy). This is done to check for the earliest forms of colorectal cancer.  Routine  screening usually begins at age 45.  Direct examination of the colon should be repeated every 5-10 years through 45 years of age. However, you may need to be screened more often if early forms of precancerous polyps or small growths are found. Skin Cancer  Check your skin from head to toe regularly.  Tell your health care provider about any new moles or changes in moles, especially if there is a change in a mole's shape or color.  Also tell your health care provider if you have a mole that is larger than the size of a pencil eraser.  Always use sunscreen. Apply sunscreen liberally and repeatedly throughout the day.  Protect yourself by wearing long sleeves, pants, a wide-brimmed hat, and sunglasses whenever you are outside. Heart disease, diabetes, and high blood pressure  High blood pressure causes heart disease and increases the risk of stroke. High blood pressure is more likely to develop in:  People who have blood pressure in the high end of the normal range (130-139/85-89 mm Hg).  People who are overweight or obese.  People who are African American.  If you are 59-24 years of age, have your blood pressure checked every 3-5 years. If you are 34 years of age or older, have your blood pressure checked every year. You should have your blood pressure measured twice-once when you are at a hospital or clinic, and once when you are not at a hospital or clinic. Record the average of the two measurements. To check your blood pressure when you are not at a hospital or clinic, you can use:  An automated blood pressure machine at a pharmacy.  A home blood pressure monitor.  If you are between 29 years and 60 years old, ask your health care provider if you should take aspirin to prevent strokes.  Have regular diabetes screenings. This involves taking a blood sample to check your fasting blood sugar level.  If you are at a normal weight and have a low risk for diabetes, have this test once  every three years after 45 years of age.  If you are overweight and have a high risk for diabetes, consider being tested at a younger age or more often. Preventing infection Hepatitis B  If you have a higher risk for hepatitis B, you should be screened for this virus. You are considered at high risk for hepatitis B if:  You were born in a country where hepatitis B is common. Ask your health care provider which countries are considered high risk.  Your parents were born in a high-risk country, and you have not been immunized against hepatitis B (hepatitis B vaccine).  You have HIV or AIDS.  You use needles to inject street drugs.  You live with someone who has hepatitis B.  You have had sex with someone who has hepatitis B.  You get hemodialysis treatment.  You take certain medicines for conditions, including cancer, organ transplantation, and autoimmune conditions. Hepatitis C  Blood testing is recommended for:  Everyone born from [birth year] through 1964.  Anyone with known risk factors for hepatitis C. Sexually transmitted infections (STIs)  You should be screened for sexually transmitted infections (STIs) including gonorrhea and chlamydia if:  You are sexually active and are younger than 45 years of age.  You are older than 45 years of age and your health care provider tells you that you are at risk for this type of infection.  Your sexual activity has changed since you were last screened and you are at an increased risk for chlamydia or gonorrhea. Ask your health care provider if you are at risk.  If you do not have HIV, but are at risk, it may be recommended that you take a prescription medicine daily to prevent HIV infection. This is called pre-exposure prophylaxis (PrEP). You are considered at risk if:  You are sexually active and do not regularly use condoms or know the HIV status of your partner(s).  You take drugs by injection.  You are sexually active with a partner  who has HIV. Talk with your health care provider about whether you are at high risk of being infected with HIV. If you choose to begin PrEP, you should first be tested for HIV. You should then be tested every 3 months for as long as you are taking PrEP. Pregnancy  If you are premenopausal and you may become pregnant, ask your health care provider about preconception counseling.  If you may become pregnant, take 400 to 800 micrograms (mcg) of folic acid  every day.  If you want to prevent pregnancy, talk to your health care provider about birth control (contraception). Osteoporosis and menopause  Osteoporosis is a disease in which the bones lose minerals and strength with aging. This can result in serious bone fractures. Your risk for osteoporosis can be identified using a bone density scan.  If you are 4 years of age or older, or if you are at risk for osteoporosis and fractures, ask your health care provider if you should be screened.  Ask your health care provider whether you should take a calcium or vitamin D supplement to lower your risk for osteoporosis.  Menopause may have certain physical symptoms and risks.  Hormone replacement therapy may reduce some of these symptoms and risks. Talk to your health care provider about whether hormone replacement therapy is right for you. Follow these instructions at home:  Schedule regular health, dental, and eye exams.  Stay current with your immunizations.  Do not use any tobacco products including cigarettes, chewing tobacco, or electronic cigarettes.  If you are pregnant, do not drink alcohol.  If you are breastfeeding, limit how much and how often you drink alcohol.  Limit alcohol intake to no more than 1 drink per day for nonpregnant women. One drink equals 12 ounces of beer, 5 ounces of wine, or 1 ounces of hard liquor.  Do not use street drugs.  Do not share needles.  Ask your health care provider for help if you need support  or information about quitting drugs.  Tell your health care provider if you often feel depressed.  Tell your health care provider if you have ever been abused or do not feel safe at home. This information is not intended to replace advice given to you by your health care provider. Make sure you discuss any questions you have with your health care provider. Document Released: 11/07/2010 Document Revised: 09/30/2015 Document Reviewed: 01/26/2015 Elsevier Interactive Patient Education  2017 [employer].

## 2016-08-06 ENCOUNTER — Encounter: Payer: Self-pay | Admitting: Family Medicine

## 2016-08-09 IMAGING — CT CT ABDOMEN WO/W CM
4 of 9 series · 12 of 46 positions shown, 19 images · IV contrast (omnipaque)
Comparison: None.

CLINICAL DATA: Pancreatic insufficiency, diarrhea x2 months

EXAM:
CT ABDOMEN WITHOUT AND WITH CONTRAST
TECHNIQUE: Multidetector CT imaging of the abdomen was performed following the
standard protocol before and following the bolus administration of
intravenous contrast.
CONTRAST:  100 mL Omnipaque 300 IV

[Series 3: pancreas arterial 25-35s 3mm · axial · arterial · 0.69mm/px · z∈[-158,-60]mm · 4 of 101 slices shown]
[im 12/101  soft-tissue]
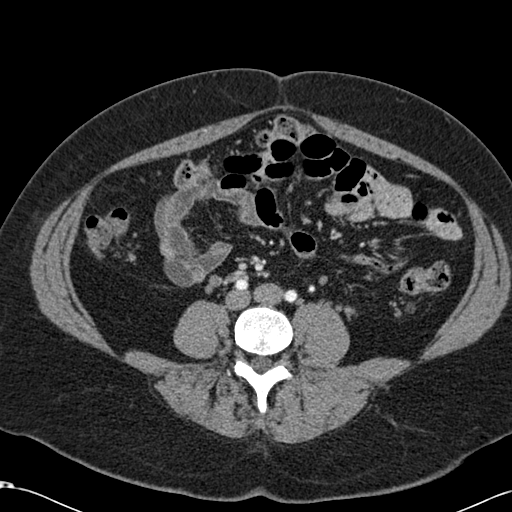
[im 23/101  soft-tissue]
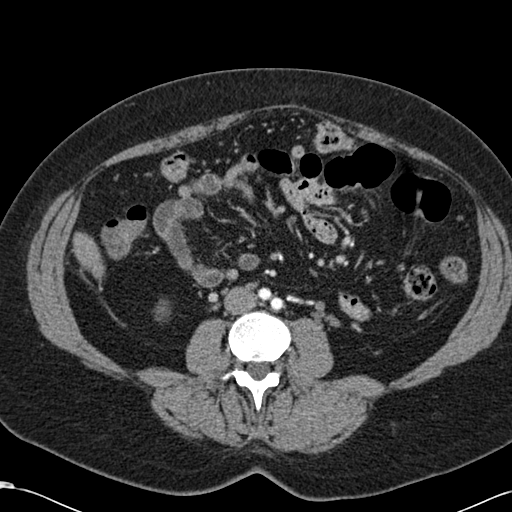
[im 34/101  soft-tissue]
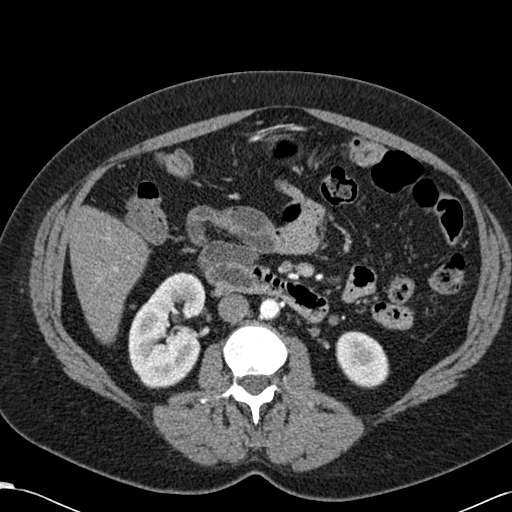
[im 45/101  soft-tissue]
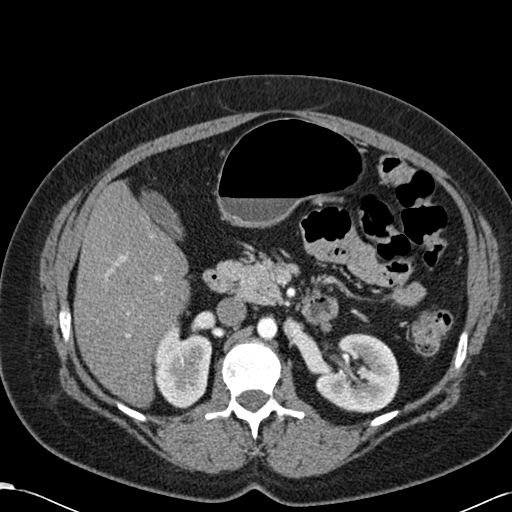

[Series 4: portal-venous 5 · axial · portal-venous · 0.71mm/px · z∈[-136,+44]mm · 4 of 62 slices shown, 9 images]
[im 13/62  soft-tissue]
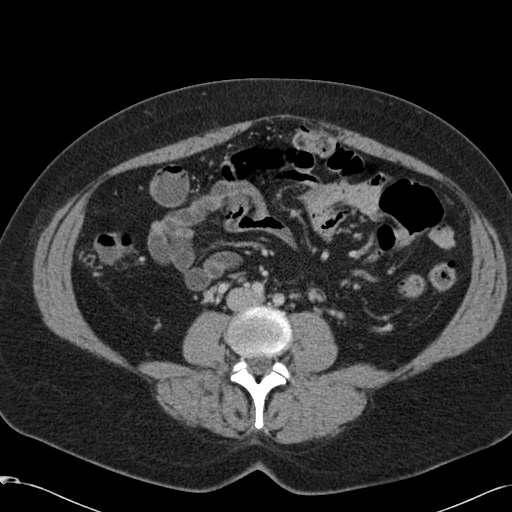
[im 13/62  lung]
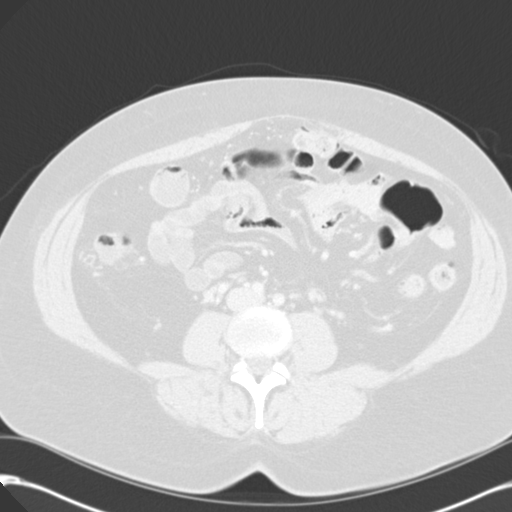
[im 13/62  bone]
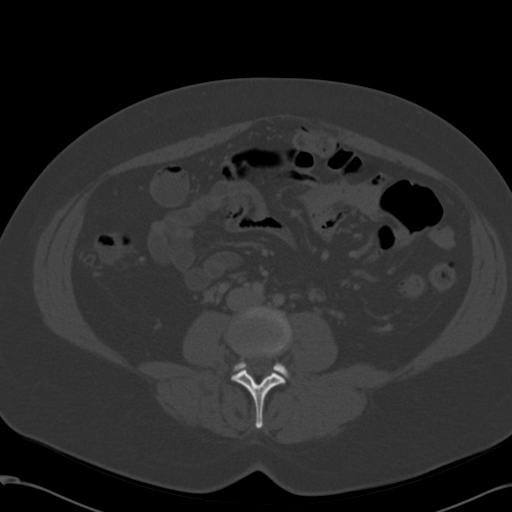
[im 25/62  soft-tissue]
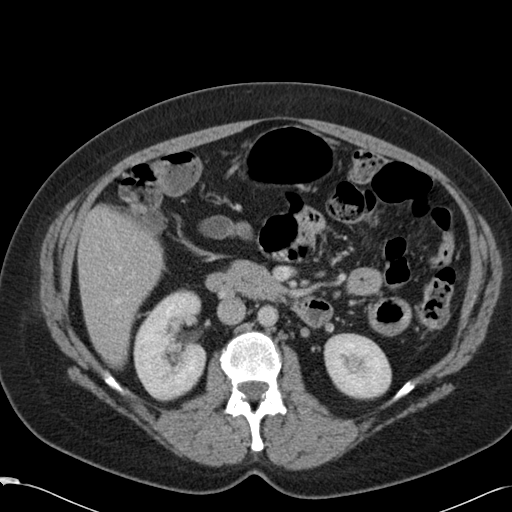
[im 25/62  lung]
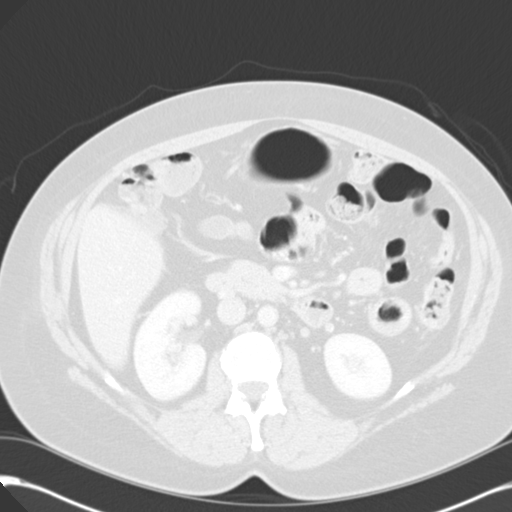
[im 37/62  soft-tissue]
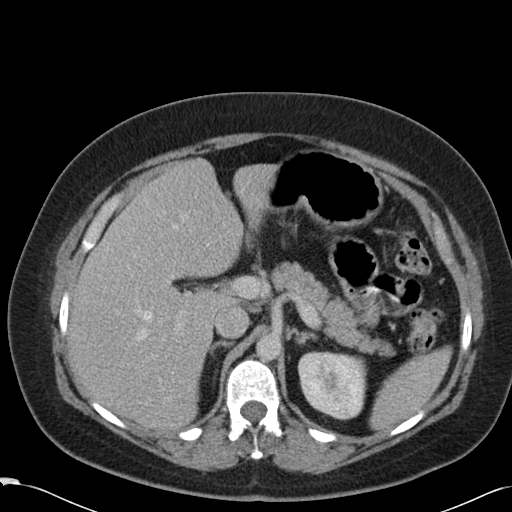
[im 37/62  lung]
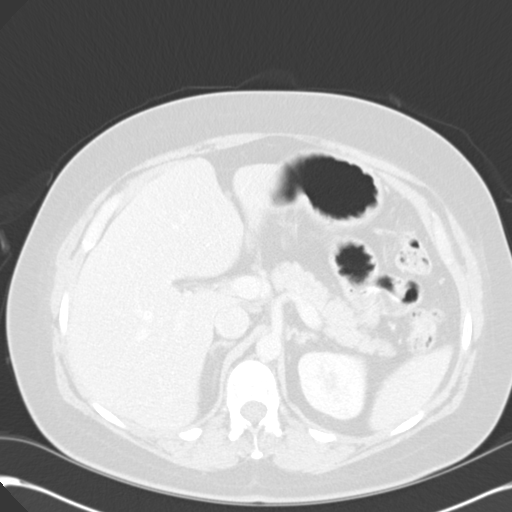
[im 49/62  soft-tissue]
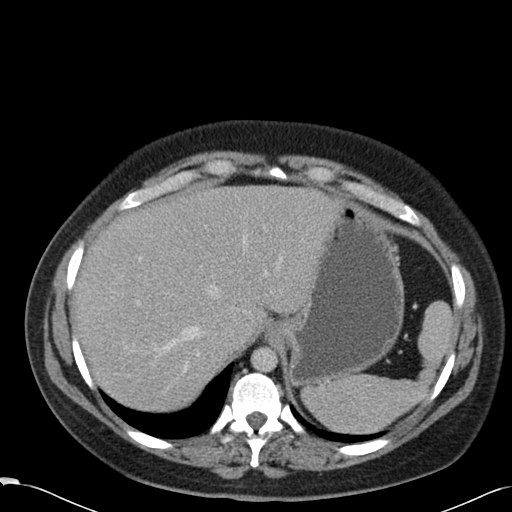
[im 49/62  lung]
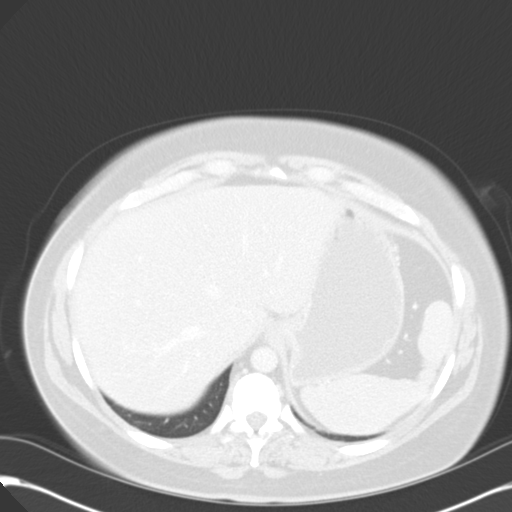

[Series 602: <mpr thick range> · coronal · 0.69mm/px · 3 of 140 slices shown, 4 images]
[im 47/140  soft-tissue]
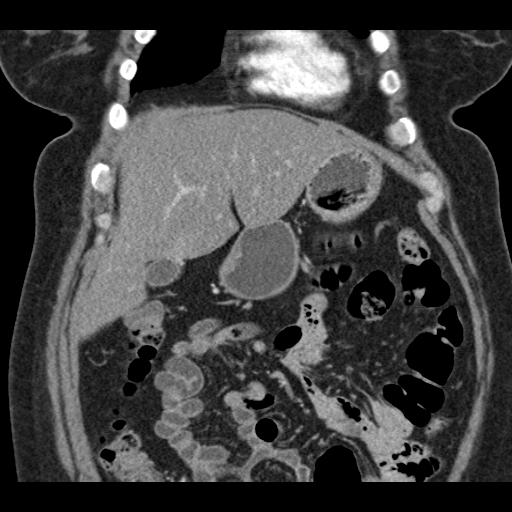
[im 70/140  soft-tissue]
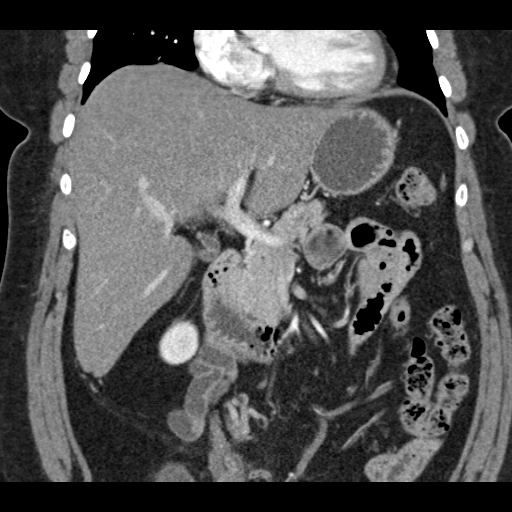
[im 70/140  bone]
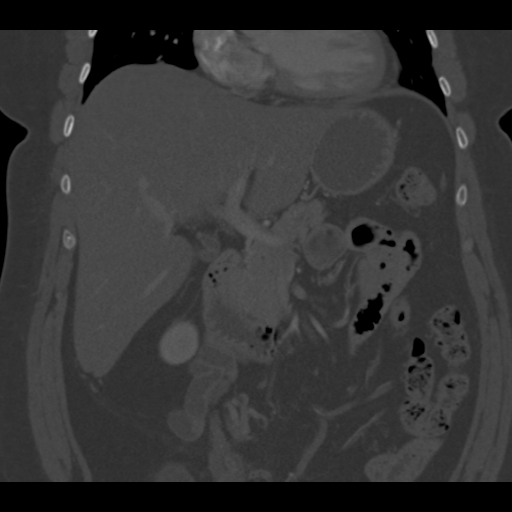
[im 93/140  soft-tissue]
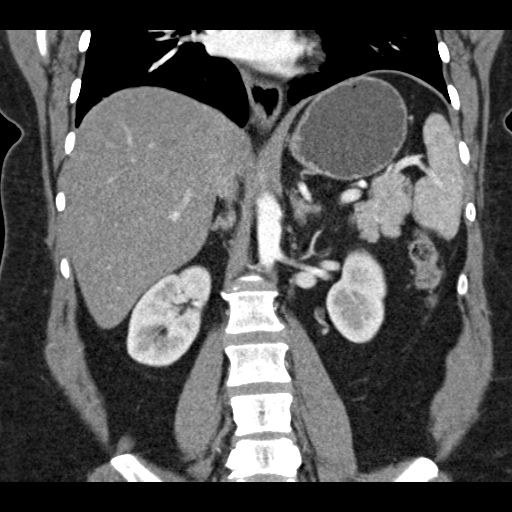

[Series 603: <mpr thick range(1)> · sagittal · 0.69mm/px · 1 of 159 slices shown, 2 images]
[im 80/159  soft-tissue]
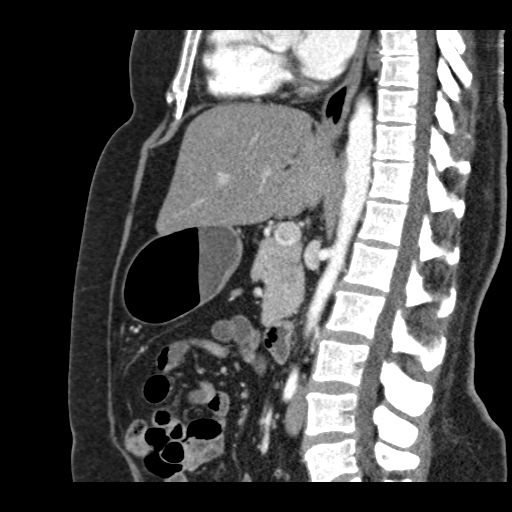
[im 80/159  bone]
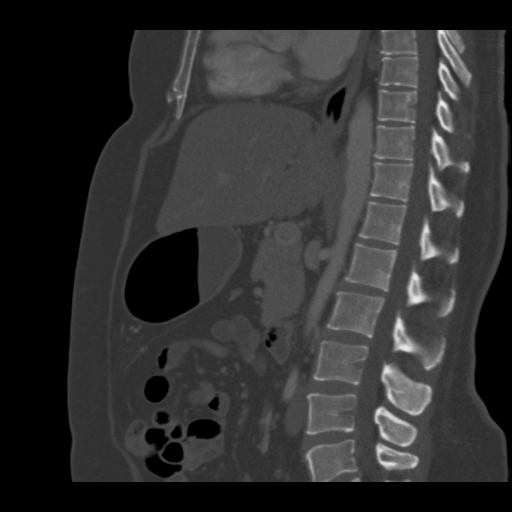

[12 of 46 positions shown; findings below may reference images not displayed]

FINDINGS: Lower chest:  Lung bases are clear.

Hepatobiliary: Liver is within normal limits. No
suspicious/enhancing hepatic lesions.

Gallbladder is unremarkable. No intrahepatic or extrahepatic ductal
dilatation.

Pancreas: Within normal limits. No pancreatic mass, atrophy, or
ductal dilatation.

Spleen: Within normal limits.

Adrenals/Urinary Tract: Adrenal glands are within normal limits.

1.8 x 1.3 cm mildly complex cyst in the posterior left upper kidney,
with a single thin septation which is partially calcified, but
without enhancement or mural nodularity, benign (Bosniak II). No
suspicious/enhancing renal lesions. No renal calculi or
hydronephrosis.

Stomach/Bowel: Stomach is within normal limits.

Visualized bowel is unremarkable.

Vascular/Lymphatic: No evidence of abdominal aortic aneurysm.

No suspicious abdominal lymphadenopathy.

Other: No abdominal ascites.

Musculoskeletal: Visualized osseous structures are within normal
limits.
IMPRESSION: Pancreas is within normal limits.

1.8 x 1.3 cm mildly complex cyst in the posterior left upper kidney,
benign (Bosniak II).

Please note that the pelvis was not imaged.

## 2016-08-22 ENCOUNTER — Other Ambulatory Visit: Payer: Self-pay | Admitting: *Deleted

## 2016-08-22 MED ORDER — SERTRALINE HCL 50 MG PO TABS: 50.0000 mg | ORAL_TABLET | Freq: Every day | ORAL | 6 refills | Status: DC

## 2016-08-22 NOTE — Telephone Encounter (Signed)
[facility].  RF request for sertraline LOV: 08/03/16 Next ov: None Last written: 08/19/15 #30 w/ 11RF

## 2016-12-21 ENCOUNTER — Other Ambulatory Visit: Payer: Self-pay | Admitting: *Deleted

## 2016-12-21 MED ORDER — CLONAZEPAM 0.5 MG PO TABS
ORAL_TABLET | ORAL | 5 refills | Status: DC
Start: 2016-12-21 — End: 2017-08-07

## 2016-12-21 NOTE — Telephone Encounter (Signed)
Rx faxed

## 2016-12-21 NOTE — Telephone Encounter (Signed)
[facility] [address]  RF request for clonazepam LOV: 08/03/16 Next ov: None Last written: 04/04/16 360 w/ 5RF  Please advise. Thanks.

## 2017-02-19 DIAGNOSIS — Z8742 Personal history of other diseases of the female genital tract: Secondary | ICD-10-CM

## 2017-02-19 HISTORY — DX: Personal history of other diseases of the female genital tract: Z87.42

## 2017-04-10 ENCOUNTER — Encounter: Payer: Self-pay | Admitting: Family Medicine

## 2017-04-10 ENCOUNTER — Other Ambulatory Visit: Payer: Self-pay | Admitting: Obstetrics and Gynecology

## 2017-04-10 HISTORY — PX: COLPOSCOPY: SHX161

## 2017-07-14 ENCOUNTER — Other Ambulatory Visit: Payer: Self-pay | Admitting: Family Medicine

## 2017-08-07 ENCOUNTER — Other Ambulatory Visit: Payer: Self-pay | Admitting: Family Medicine

## 2017-08-07 NOTE — Telephone Encounter (Signed)
LOV: 08/03/16   Dr. Anitra Lauth   [facility] [address]

## 2017-08-07 NOTE — Telephone Encounter (Signed)
Copied from [place] [phone]. Topic: General - Other >> Aug 07, 2017  4:24 PM Darl Householder, RMA wrote: Reason for CRM: Medication refill request for clonazePAM (KLONOPIN) 0.5 MG tablet  to be sent to [facility] [address]

## 2017-08-08 MED ORDER — CLONAZEPAM 0.5 MG PO TABS: ORAL_TABLET | ORAL | 0 refills | Status: AC

## 2017-08-08 NOTE — Telephone Encounter (Signed)
RF request for clonazepam LOV: 08/03/16 Next ov: None Last written: 12/21/16 #60 w/ 5RF  Please advise. Thanks.

## 2017-08-08 NOTE — Telephone Encounter (Signed)
SW pt, she stated that she has moved out of the state and is trying to find a new PCP.   Rx faxed.

## 2017-08-08 NOTE — Telephone Encounter (Signed)
Will do #60, no additional RF. Pt due for office f/u---this can be for CPE OR just for f/u restless legs syndrome.-thx

## 2017-09-27 ENCOUNTER — Other Ambulatory Visit: Payer: Self-pay | Admitting: Family Medicine

## 2017-09-27 NOTE — Telephone Encounter (Signed)
Will do one 30 day supply, pt [place] per refill note on 08/16/17.

## 2022-01-05 ENCOUNTER — Encounter: Payer: Self-pay | Admitting: Internal Medicine
